# Patient Record
Sex: Female | Born: 1995 | Race: White | Hispanic: No | Marital: Single
Health system: Midwestern US, Academic
[De-identification: ages and names within clinical notes are randomized; demographics above are authoritative.]

---

## 2021-06-24 ENCOUNTER — Encounter: Admit: 2021-06-24 | Discharge: 2021-06-24 | Primary: Family

## 2021-06-24 ENCOUNTER — Emergency Department: Admit: 2021-06-24 | Discharge: 2021-06-24

## 2021-06-24 ENCOUNTER — Inpatient Hospital Stay: Admit: 2021-06-24

## 2021-06-24 DIAGNOSIS — F32A Depression: Secondary | ICD-10-CM

## 2021-06-24 DIAGNOSIS — I890 Lymphedema, not elsewhere classified: Secondary | ICD-10-CM

## 2021-06-24 DIAGNOSIS — E119 Type 2 diabetes mellitus without complications: Secondary | ICD-10-CM

## 2021-06-24 DIAGNOSIS — L03211 Cellulitis of face: Secondary | ICD-10-CM

## 2021-06-24 DIAGNOSIS — Z8742 Personal history of other diseases of the female genital tract: Secondary | ICD-10-CM

## 2021-06-24 DIAGNOSIS — I10 Essential (primary) hypertension: Secondary | ICD-10-CM

## 2021-06-24 DIAGNOSIS — F419 Anxiety disorder, unspecified: Secondary | ICD-10-CM

## 2021-06-24 DIAGNOSIS — Z789 Other specified health status: Secondary | ICD-10-CM

## 2021-06-24 LAB — BETA-HCG: BETA-HCG: <1 U/L (ref <5)

## 2021-06-24 LAB — CBC AND DIFF
ABSOLUTE BASO COUNT: 0 K/UL (ref 0–0.20)
ABSOLUTE EOS COUNT: 1.3 K/UL — ABNORMAL HIGH (ref 0–0.45)
ABSOLUTE MONO COUNT: 0.7 K/UL (ref 0–0.80)
MDW (MONOCYTE DISTRIBUTION WIDTH): 21 — ABNORMAL HIGH (ref <20.7)
WBC COUNT: 12 K/UL — ABNORMAL HIGH (ref 4.5–11.0)

## 2021-06-24 LAB — COMPREHENSIVE METABOLIC PANEL
ALBUMIN: 4.1 g/dL (ref 3.5–5.0)
ALK PHOSPHATASE: 28 U/L — ABNORMAL LOW (ref 25–110)
ALT: 19 U/L (ref 7–56)
ANION GAP: 13 K/UL — ABNORMAL HIGH (ref 3–12)
AST: 14 U/L (ref 7–40)
BLD UREA NITROGEN: 12 mg/dL (ref 7–25)
CALCIUM: 10 mg/dL (ref 8.5–10.6)
CHLORIDE: 98 MMOL/L — ABNORMAL LOW (ref 98–110)
CO2: 25 MMOL/L — ABNORMAL HIGH (ref 21–30)
CREATININE: 0.8 mg/dL (ref 0.4–1.00)
EGFR: >60 mL/min (ref >60)
GLUCOSE,PANEL: 243 mg/dL — ABNORMAL HIGH (ref 70–100)
POTASSIUM: 3.8 MMOL/L — ABNORMAL LOW (ref 3.5–5.1)
SODIUM: 136 MMOL/L — ABNORMAL LOW (ref 137–147)
TOTAL BILIRUBIN: 0.7 mg/dL (ref 0.3–1.2)
TOTAL PROTEIN: 7.5 g/dL (ref 6.0–8.0)

## 2021-06-24 LAB — POC LACTATE
LACTIC ACID POC: 2.2 MMOL/L — ABNORMAL HIGH (ref 0.5–2.0)
LACTIC ACID POC: 1.3 MMOL/L (ref 0.5–2.0)

## 2021-06-24 LAB — POC GLUCOSE
POC GLUCOSE: 270 mg/dL — ABNORMAL HIGH (ref 70–100)
POC GLUCOSE: 267 mg/dL — ABNORMAL HIGH (ref 70–100)

## 2021-06-24 MED ORDER — PANTOPRAZOLE 40 MG PO TBEC
40 mg | Freq: Every day | ORAL | 0 refills | Status: AC
Start: 2021-06-24 — End: ?
  Administered 2021-06-25 – 2021-06-28 (×4): 40 mg via ORAL

## 2021-06-24 MED ORDER — FLUOXETINE 20 MG PO CAP
40 mg | Freq: Every evening | ORAL | 0 refills | Status: AC
Start: 2021-06-24 — End: ?
  Administered 2021-06-25 – 2021-06-28 (×4): 40 mg via ORAL

## 2021-06-24 MED ORDER — FLUOXETINE 20 MG PO CAP
40 mg | Freq: Every day | ORAL | 0 refills | Status: DC
Start: 2021-06-24 — End: 2021-06-25

## 2021-06-24 MED ORDER — FENTANYL CITRATE (PF) 50 MCG/ML IJ SOLN
50 ug | Freq: Once | INTRAVENOUS | 0 refills | Status: CP
Start: 2021-06-24 — End: ?
  Administered 2021-06-24: 23:00:00 50 ug via INTRAVENOUS

## 2021-06-24 MED ORDER — METOPROLOL TARTRATE 25 MG PO TAB
25 mg | Freq: Two times a day (BID) | ORAL | 0 refills | Status: AC
Start: 2021-06-24 — End: ?
  Administered 2021-06-25 – 2021-06-28 (×8): 25 mg via ORAL

## 2021-06-24 MED ORDER — LACTOBACILLUS RHAMNOSUS GG 15 BILLION CELL PO CPSP
1 | Freq: Every day | ORAL | 0 refills | Status: AC
Start: 2021-06-24 — End: ?
  Administered 2021-06-25 – 2021-06-28 (×4): 1 via ORAL

## 2021-06-24 MED ORDER — IOHEXOL 350 MG IODINE/ML IV SOLN
80 mL | Freq: Once | INTRAVENOUS | 0 refills | Status: CP
Start: 2021-06-24 — End: ?
  Administered 2021-06-24: 20:00:00 80 mL via INTRAVENOUS

## 2021-06-24 MED ORDER — VANCOMYCIN 1.5G IN 0.9% NACL IVPB (BATCHED)
1500 mg | Freq: Two times a day (BID) | INTRAVENOUS | 0 refills | Status: DC
Start: 2021-06-24 — End: 2021-06-25

## 2021-06-24 MED ORDER — LACTATED RINGERS IV SOLP
30 mL/kg | INTRAVENOUS | 0 refills | Status: CP
Start: 2021-06-24 — End: ?
  Administered 2021-06-24: 22:00:00 1917 mL via INTRAVENOUS

## 2021-06-24 MED ORDER — VANCOMYCIN 2,500 MG IVPB
2500 mg | Freq: Once | INTRAVENOUS | 0 refills | Status: CP
Start: 2021-06-24 — End: ?
  Administered 2021-06-24 (×2): 2500 mg via INTRAVENOUS

## 2021-06-24 MED ORDER — ACETAMINOPHEN 500 MG PO TAB
500 mg | ORAL | 0 refills | Status: AC | PRN
Start: 2021-06-24 — End: ?
  Administered 2021-06-25 – 2021-06-27 (×3): 500 mg via ORAL

## 2021-06-24 MED ORDER — ENOXAPARIN 40 MG/0.4 ML SC SYRG
40 mg | Freq: Two times a day (BID) | SUBCUTANEOUS | 0 refills | Status: DC
Start: 2021-06-24 — End: 2021-06-25

## 2021-06-24 MED ORDER — PIPERACILLIN/TAZOBACTAM 4.5 G/100ML NS IVPB (MB+)
4.5 g | Freq: Once | INTRAVENOUS | 0 refills | Status: CP
Start: 2021-06-24 — End: ?
  Administered 2021-06-24 (×2): 4.5 g via INTRAVENOUS

## 2021-06-24 MED ORDER — LOSARTAN 50 MG PO TAB
100 mg | Freq: Every day | ORAL | 0 refills | Status: DC
Start: 2021-06-24 — End: 2021-06-25

## 2021-06-24 MED ORDER — SODIUM CHLORIDE 0.9 % IJ SOLN
50 mL | Freq: Once | INTRAVENOUS | 0 refills | Status: CP
Start: 2021-06-24 — End: ?
  Administered 2021-06-24: 20:00:00 50 mL via INTRAVENOUS

## 2021-06-24 MED ORDER — INSULIN ASPART 100 UNIT/ML SC FLEXPEN
0-6 [IU] | Freq: Before meals | SUBCUTANEOUS | 0 refills | Status: AC
Start: 2021-06-24 — End: ?
  Administered 2021-06-25: 03:00:00 2 [IU] via SUBCUTANEOUS

## 2021-06-24 MED ORDER — LOSARTAN 50 MG PO TAB
100 mg | Freq: Every evening | ORAL | 0 refills | Status: AC
Start: 2021-06-24 — End: ?
  Administered 2021-06-25 – 2021-06-28 (×4): 100 mg via ORAL

## 2021-06-24 MED ORDER — SODIUM CHLORIDE 0.9 % IV SOLP
1000 mL | Freq: Once | INTRAVENOUS | 0 refills | Status: CP
Start: 2021-06-24 — End: ?
  Administered 2021-06-25: 04:00:00 1000 mL via INTRAVENOUS

## 2021-06-24 MED ORDER — HYDROXYZINE HCL 25 MG PO TAB
25 mg | ORAL | 0 refills | Status: AC | PRN
Start: 2021-06-24 — End: ?
  Administered 2021-06-25 – 2021-06-27 (×2): 25 mg via ORAL

## 2021-06-24 MED ORDER — MELATONIN 5 MG PO TAB
5 mg | Freq: Every evening | ORAL | 0 refills | Status: AC | PRN
Start: 2021-06-24 — End: ?
  Administered 2021-06-25 – 2021-06-28 (×3): 5 mg via ORAL

## 2021-06-24 MED ORDER — ONDANSETRON HCL (PF) 4 MG/2 ML IJ SOLN
4 mg | INTRAVENOUS | 0 refills | Status: AC | PRN
Start: 2021-06-24 — End: ?

## 2021-06-24 MED ORDER — FUROSEMIDE 20 MG PO TAB
20 mg | Freq: Every morning | ORAL | 0 refills | Status: DC
Start: 2021-06-24 — End: 2021-06-25

## 2021-06-24 MED ORDER — PIPERACILLIN/TAZOBACTAM 4.5 G/100ML NS IVPB (MB+)
4.5 g | INTRAVENOUS | 0 refills | Status: AC
Start: 2021-06-24 — End: ?
  Administered 2021-06-25 (×6): 4.5 g via INTRAVENOUS

## 2021-06-24 MED ORDER — ENOXAPARIN 60 MG/0.6 ML SC SYRG
60 mg | Freq: Two times a day (BID) | SUBCUTANEOUS | 0 refills | Status: AC
Start: 2021-06-24 — End: ?
  Administered 2021-06-25 – 2021-06-28 (×7): 60 mg via SUBCUTANEOUS

## 2021-06-24 MED ORDER — VANCOMYCIN PHARMACY TO MANAGE
1 | 0 refills | Status: AC
Start: 2021-06-24 — End: ?

## 2021-06-24 MED ORDER — SODIUM CHLORIDE 0.9 % IV SOLP
INTRAVENOUS | 0 refills | Status: DC
Start: 2021-06-24 — End: 2021-06-25
  Administered 2021-06-25: 03:00:00 via INTRAVENOUS

## 2021-06-24 MED ORDER — VANCOMYCIN 1,750 MG IVPB
1750 mg | Freq: Two times a day (BID) | INTRAVENOUS | 0 refills | Status: AC
Start: 2021-06-24 — End: ?
  Administered 2021-06-25 – 2021-06-27 (×10): 1750 mg via INTRAVENOUS

## 2021-06-24 MED ORDER — ONDANSETRON 4 MG PO TBDI
4 mg | ORAL | 0 refills | Status: AC | PRN
Start: 2021-06-24 — End: ?

## 2021-06-24 MED ORDER — HYDROCHLOROTHIAZIDE 12.5 MG PO TAB
25 mg | Freq: Every morning | ORAL | 0 refills | Status: AC
Start: 2021-06-24 — End: ?
  Administered 2021-06-25 – 2021-06-28 (×4): 25 mg via ORAL

## 2021-06-24 MED ORDER — MORPHINE 4 MG/ML IV SYRG
4 mg | Freq: Once | INTRAVENOUS | 0 refills | Status: CP
Start: 2021-06-24 — End: ?
  Administered 2021-06-24: 19:00:00 4 mg via INTRAVENOUS

## 2021-06-24 MED ORDER — INSULIN GLARGINE 100 UNIT/ML (3 ML) SC INJ PEN
34 [IU] | Freq: Every evening | SUBCUTANEOUS | 0 refills | Status: AC
Start: 2021-06-24 — End: ?
  Administered 2021-06-25: 04:00:00 34 [IU] via SUBCUTANEOUS

## 2021-06-24 NOTE — ED Notes
Report attempt x1.

## 2021-06-24 NOTE — ED Notes
Report given to RN at [facility].

## 2021-06-24 NOTE — ED Notes
Report attempt x2.

## 2021-06-25 ENCOUNTER — Encounter: Admit: 2021-06-25 | Discharge: 2021-06-25 | Primary: Family

## 2021-06-25 MED ADMIN — SODIUM CHLORIDE 0.9 % IV PGBK (MB+) [95161]: 3 g | INTRAVENOUS | @ 22:00:00 | NDC 00338915930

## 2021-06-25 MED ADMIN — ACETAMINOPHEN 500 MG PO TAB [102]: 500 mg | ORAL | @ 05:00:00 | Stop: 2021-06-25 | NDC 00904673061

## 2021-06-25 MED ADMIN — AMPICILLIN-SULBACTAM 3 GRAM IJ SOLR [32471]: 3 g | INTRAVENOUS | @ 22:00:00 | NDC 00049001481

## 2021-06-25 NOTE — Consults
OTO/HNS Consult Note      Admission Date: 06/24/2021                                                LOS: 0 days    Reason for Consult: Facial cellulitis, rule out Ludwig's Angina    Consult type: Opinion with orders    Assessment/Plan   Samantha Valencia is a 25 y.o. female who presents with 1 week of left-sided facial swelling and pain with exam and imaging consistent with facial cellulitis.     -No acute ENT intervention recommended  -Patient is currently admitted to medicine service  -Defer antibiotic treatment to primary team and infectious disease  -Please contact ENT team if patient develops any progressive difficulty breathing, difficulty tolerating oral secretions, or fluctuance/fluid collection concerning for drainable abscess.    -Incidentally, patient noted to have significant crusting in bilateral nasal cavities with no signs of active infection.  -Recommend patient use nasal saline sprays twice daily for nasal crusting/dry mucosa.  -If patient were to require oxygen, recommend oxygen to be provided with humidity.      Patient discussed with Dr. Glendon Axe.  Thank you for this consult. Please page with any questions or concerns.    Alveta Heimlich, MD  Otolaryngology Resident  [phone] (Team Pager)  ______________________________________________________________________    History of Present Illness: Samantha Valencia is a 25 y.o. female with PMH anxiety, depression, diabetes, PCOS, hypertension who presents to [facility] ED with 1 week of facial swelling and pain.  Patient reports she was in her baseline state of health until approximately 1 week ago when she had a pimple on her left chin that began to get very irritated.  Patient reports she noted the surrounding skin started becoming red and swollen.  This initially progressed slowly but over the past few days has increased significantly.    Patient reports presenting to her PCP yesterday who started the patient on oral antibiotics.  Unfortunately, this did not provide the patient immediate relief and the following day (today), patient noticed further increase in swelling prompting her to present to [facility] ED.    In [facility] ED, patient is noted to be afebrile.  She denies difficulty breathing, significant voice changes, dysphagia, odynophagia.  Denies trismus.  CT scan obtained which shows infection is primarily superficial to platysma, though there is some evidence of inflammation deep to platysma in the submandibular space.  No drainable abscess is identified.  Patient denies history of any significant facial swelling/infection in the past.  Does report history of getting frequent infections including pharyngitis, sinus infections, and ear infections.  Incidentally, patient reports history of intermittent nosebleeds.  Reports that she does use regular steroid nasal spray but no nasal saline.    Medical History:   Diagnosis Date   ? Anxiety    ? Depression    ? Diabetes (HCC)    ? History of PCOS    ? Hypertension    ? Lymphedema      History reviewed. No pertinent surgical history.  Social History     Socioeconomic History   ? Marital status: Single   Tobacco Use   ? Smoking status: Never Smoker   ? Smokeless tobacco: Never Used   Substance and Sexual Activity   ? Alcohol use: Never   ? Drug use: Never     Family history  reviewed; non-contributory  Allergies:  Patient has no known allergies.    Scheduled Meds:enoxaparin (LOVENOX) syringe 40 mg, 40 mg, Subcutaneous, BID  [START ON 06/25/2021] FLUoxetine (PROzac) capsule 40 mg, 40 mg, Oral, QDAY  [START ON 06/25/2021] hydroCHLOROthiazide tablet 25 mg, 25 mg, Oral, QAM8  insulin aspart (U-100) (NOVOLOG FLEXPEN U-100 INSULIN) injection PEN 0-6 Units, 0-6 Units, Subcutaneous, ACHS (22)  insulin glargine (LANTUS SOLOSTAR U-100 INSULIN) injection PEN 34 Units, 34 Units, Subcutaneous, QHS  lactobacillus rhamnosus GG (CULTURELLE) 15 billion cell capsule 1 capsule, 1 capsule, Oral, QDAY w/lunch  [START ON 06/25/2021] losartan (COZAAR) tablet 100 mg, 100 mg, Oral, QDAY  metoprolol tartrate tablet 25 mg, 25 mg, Oral, BID  [START ON 06/25/2021] pantoprazole DR (PROTONIX) tablet 40 mg, 40 mg, Oral, QDAY 30 min before breakfast  piperacillin/tazobactam (ZOSYN) 4.5 g in sodium chloride 0.9% (NS) 100 mL IVPB (MB+), 4.5 g, Intravenous, Q6H*    Continuous Infusions:  ? sodium chloride 0.9 %   infusion       PRN and Respiratory Meds:acetaminophen Q4H PRN, melatonin QHS PRN, ondansetron Q6H PRN **OR** ondansetron (ZOFRAN) IV Q6H PRN, vancomycin, pharmacy to manage Per Pharmacy    Review of Systems:  10 point ROS conducted and negative except as noted in HPI.  Vital Signs:  Last Filed in 24 hours Vital Signs:  24 hour Range    BP: 118/100 (08/25 2005)  Temp: 36.9 ?C (98.4 ?F) (08/25 2005)  Pulse: 104 (08/25 2005)  Respirations: 18 PER MINUTE (08/25 2005)  SpO2: 98 % (08/25 2005)  O2 Device: None (Room air) (08/25 2005)  SpO2 Pulse: 96 (08/25 1900)  Height: 172.7 cm (5' 8) (08/25 2005) BP: (79-152)/(69-100)   Temp:  [36.2 ?C (97.2 ?F)-36.9 ?C (98.4 ?F)]   Pulse:  [100-104]   Respirations:  [18 PER MINUTE]   SpO2:  [93 %-99 %]   O2 Device: None (Room air)     Physical Exam:  General: Sitting upright in bed in no acute distress, morbidly obese  Inspection/palpation: Significant swelling of left cheek, chin, and left upper neck with overlying erythema.  This area is tender to palpation and moderately indurated.  No fluctuance.  Facial Strength: Facial motility symmetric and full bilaterally   Hearing: Hearing adequate for verbal communication bilaterally   Pinna: External ear intact and fully developed   External Canal: Canal with mild cerumen  Tympanic Membrane: TM intact bilaterally, no perforation or effusion  External Nose: No scar or anatomic deformity   Internal Nose: Septum intact. No anterior nasal drainage. No edema or polyp in anterior nasal cavity.  Oral cavity, Lips, Teeth, and Gums: Mucosa intact and viable, dentition in decent repair. No lesions, masses or ulcers.  Floor of mouth is soft.  Oropharynx: No exudate, no masses or ulcerations  Larynx: Normal voice, no stridor or stertor.   Neck, Trachea, Lymphatics: Midline trachea without mass or lesion, no significant lymphadenopathy  Thyroid: No mass or nodularity   Eyes: No nystagmus with equal extraocular motion bilaterally   Neuro/Psych/Balance: Patient appropriate in interaction; Appropriate mood and affect; Cranial nerves II-XII intact   Respiratory effort: Equal inspiration and expiration, no respiratory distress       LARYNGOSCOPY: Flexible laryngoscopy was performed using flexible laryngoscope through the left nare.  Significant nasal crusting and dried blood was observed in the bilateral nasal cavities.  No mucopus or signs of active infection was observed in the nose.  The nasopharyngeal mucosa appeared normal. The fossa of Rosenmuller was clear and the  eustachian tube openings were without mass or lesion. The tongue base was symmetric and without mucosal mass or lesion.  The vallecula was clear.  The epiglottis was upright.  The aryepiglottic folds and false folds appeared normal and were without mucosal mass or lesion.  The visualized hypopharynx was unremarkable.  The true vocal folds were without discernable mucosal or submucosal lesions.  The right and left cords adducted and abducted fully.  Though difficult to assess without stroboscopy, closure appeared to be complete.  A limited view of the subglottis revealed no mucosal lesions or stenosis.  The patient tolerated the procedure well.        Lab/Radiology/Other Diagnostic Tests:  24-hour labs:    Results for orders placed or performed during the hospital encounter of 06/24/21 (from the past 24 hour(s))   CBC AND DIFF    Collection Time: 06/24/21  2:10 PM   Result Value Ref Range    White Blood Cells 12.9 (H) 4.5 - 11.0 K/UL    RBC 3.96 (L) 4.0 - 5.0 M/UL    Hemoglobin 11.5 (L) 12.0 - 15.0 GM/DL    Hematocrit 09.8 (L) 36 - 45 %    MCV 87.0 80 - 100 FL    MCH 29.1 26 - 34 PG    MCHC 33.4 32.0 - 36.0 G/DL    RDW 11.9 11 - 15 %    Platelet Count 318 150 - 400 K/UL    MPV 7.5 7 - 11 FL    Neutrophils 69 41 - 77 %    Lymphocytes 15 (L) 24 - 44 %    Monocytes 6 4 - 12 %    Eosinophils 10 (H) 0 - 5 %    Basophils 0 0 - 2 %    Absolute Neutrophil Count 8.76 (H) 1.8 - 7.0 K/UL    Absolute Lymph Count 1.97 1.0 - 4.8 K/UL    Absolute Monocyte Count 0.77 0 - 0.80 K/UL    Absolute Eosinophil Count 1.32 (H) 0 - 0.45 K/UL    Absolute Basophil Count 0.04 0 - 0.20 K/UL    MDW (Monocyte Distribution Width) 21.4 (H) <20.7   COMPREHENSIVE METABOLIC PANEL    Collection Time: 06/24/21  2:10 PM   Result Value Ref Range    Sodium 136 (L) 137 - 147 MMOL/L    Potassium 3.8 3.5 - 5.1 MMOL/L    Chloride 98 98 - 110 MMOL/L    Glucose 243 (H) 70 - 100 MG/DL    Blood Urea Nitrogen 12 7 - 25 MG/DL    Creatinine 1.47 0.4 - 1.00 MG/DL    Calcium 82.9 8.5 - 56.2 MG/DL    Total Protein 7.5 6.0 - 8.0 G/DL    Total Bilirubin 0.7 0.3 - 1.2 MG/DL    Albumin 4.1 3.5 - 5.0 G/DL    Alk Phosphatase 28 25 - 110 U/L    AST (SGOT) 14 7 - 40 U/L    CO2 25 21 - 30 MMOL/L    ALT (SGPT) 19 7 - 56 U/L    Anion Gap 13 (H) 3 - 12    eGFR >60 >60 mL/min   BETA-HCG    Collection Time: 06/24/21  2:10 PM   Result Value Ref Range    Beta-HCG,Serum <1 <5 U/L   POC GLUCOSE    Collection Time: 06/24/21  2:13 PM   Result Value Ref Range    Glucose, POC 270 (H) 70 - 100 MG/DL   POC LACTATE  Collection Time: 06/24/21  2:16 PM   Result Value Ref Range    LACTIC ACID POC 2.2 (H) 0.5 - 2.0 MMOL/L   POC LACTATE    Collection Time: 06/24/21  4:42 PM   Result Value Ref Range    LACTIC ACID POC 1.3 0.5 - 2.0 MMOL/L   POC GLUCOSE    Collection Time: 06/24/21  8:22 PM   Result Value Ref Range    Glucose, POC 267 (H) 70 - 100 MG/DL         CT neck reviewed:  1. ?Left facial and upper neck cellulitis-myositis which is primarily superficial to platysma but also extends deep to   the platysma, involving the left submandibular, submental, and   buccal-labial spaces. No evidence of drainable fluid collection or soft   tissue gas.   2. ?Prominent and mildly enlarged cervical lymph nodes, favored to be   reactive.   3. ?Mildly prominent bilateral adenoidal and palatine tonsils, likely   reactive.     Problem List:  Patient Active Problem List    Diagnosis Date Noted   ? Facial cellulitis 06/24/2021

## 2021-06-25 NOTE — Drug Level
Pharmacy Vancomycin Note  Subjective:   Samantha Valencia is a 25 y.o. female being treated for sepsis due to facial cellulitis.    Assessment:   Target levels for this patient:  1.  AUC (mcg*h/mL):  400-600  2.   trough 15    Evaluation of AUC and/or level(s):   To be ordered later.       Plan:   1. 2500mg  Vanco received in ED at 1830==will continue in the morning with 1750mg  IV q12h   2. Next scheduled level(s):  TBD   3. Pharmacy will continue to monitor and adjust therapy as needed.      Objective:   Calculations:  Calculated True Peak (mcg/mL): 39.3 mcg/mL  Calculated Trough (mcg/mL):  13.2 mcg/mL  Rate of elimination (h-1):  0.1  Half Life (hr):  6.66 hours  Volume of distribution (L/kg): 0.4 L/kg  AUC (mcg*h/mL): 539 mcg*h/mL    Current Vancomycin Orders   Medication Dose Route Frequency   ? [START ON 06/25/2021] vancomycin (VANCOCIN) 1,750 mg in sodium chloride 0.9% (NS) 285 mL IVPB  1,750 mg Intravenous Q12H*   ? vancomycin, pharmacy to manage  1 each Service Per Pharmacy       Recent Vancomycin Dosing and Administration:  Recent Vancomycin Admin                   vancomycin (VANCOCIN) 2,500 mg in sodium chloride 0.9% (NS) 550 mL IVPB (mg) 2,500 mg New Bag 06/24/21 1537                Start Date of  vancomycin therapy: 06/24/2021  Additional ZOX:WRUEA 4.5g IV q6h    Cultures:  ,  ,  ,    White Blood Cells   Date/Time Value Ref Range Status   06/24/2021 1410 12.9 (H) 4.5 - 11.0 K/UL Final     Creatinine   Date/Time Value Ref Range Status   06/24/2021 1410 0.87 0.4 - 1.00 MG/DL Final     Blood Urea Nitrogen   Date/Time Value Ref Range Status   06/24/2021 1410 12 7 - 25 MG/DL Final     Estimated CrCl:  164ml/min      Intake/Output Summary (Last 24 hours) at 06/24/2021 2348  Last data filed at 06/24/2021 2300  Gross per 24 hour   Intake 3117 ml   Output --   Net 3117 ml      UOP:    Actual Weight:  156.7 kg (345 lb 6.4 oz)  Dosing BW:  156.7 kg   Drug Levels:  No results found for: VANCOMYCIN 2HR POST DOSE, VANCOMYCIN TROUGH, VANCOMYCIN RANDOM    Fredric Mare, [facility]  06/24/2021

## 2021-06-26 MED ADMIN — AMPICILLIN-SULBACTAM 3 GRAM IJ SOLR [32471]: 3 g | INTRAVENOUS | @ 10:00:00 | NDC 00049001481

## 2021-06-26 MED ADMIN — SODIUM CHLORIDE 0.9 % IV PGBK (MB+) [95161]: 3 g | INTRAVENOUS | @ 17:00:00 | NDC 00338915930

## 2021-06-26 MED ADMIN — AMPICILLIN-SULBACTAM 3 GRAM IJ SOLR [32471]: 3 g | INTRAVENOUS | @ 04:00:00 | NDC 00049001481

## 2021-06-26 MED ADMIN — AMPICILLIN-SULBACTAM 3 GRAM IJ SOLR [32471]: 3 g | INTRAVENOUS | @ 17:00:00 | NDC 00049001481

## 2021-06-26 MED ADMIN — SODIUM CHLORIDE 0.9 % IV PGBK (MB+) [95161]: 3 g | INTRAVENOUS | @ 04:00:00 | NDC 00338915930

## 2021-06-26 MED ADMIN — SODIUM CHLORIDE 0.9 % IV PGBK (MB+) [95161]: 3 g | INTRAVENOUS | @ 10:00:00 | NDC 00338915930

## 2021-06-26 MED ADMIN — SODIUM CHLORIDE 0.9 % IV PGBK (MB+) [95161]: 3 g | INTRAVENOUS | @ 21:00:00 | NDC 00338915930

## 2021-06-26 MED ADMIN — AMPICILLIN-SULBACTAM 3 GRAM IJ SOLR [32471]: 3 g | INTRAVENOUS | @ 21:00:00 | NDC 00049001481

## 2021-06-27 ENCOUNTER — Inpatient Hospital Stay: Admit: 2021-06-27 | Discharge: 2021-06-27 | Primary: Family

## 2021-06-27 MED ADMIN — AMPICILLIN-SULBACTAM 3 GRAM IJ SOLR [32471]: 3 g | INTRAVENOUS | @ 16:00:00 | NDC 00049001481

## 2021-06-27 MED ADMIN — AMPICILLIN-SULBACTAM 3 GRAM IJ SOLR [32471]: 3 g | INTRAVENOUS | @ 09:00:00 | NDC 00049001481

## 2021-06-27 MED ADMIN — AMPICILLIN-SULBACTAM 3 GRAM IJ SOLR [32471]: 3 g | INTRAVENOUS | @ 22:00:00 | NDC 00049001481

## 2021-06-27 MED ADMIN — SODIUM CHLORIDE 0.9 % IV PGBK (MB+) [95161]: 3 g | INTRAVENOUS | @ 22:00:00 | NDC 00338915930

## 2021-06-27 MED ADMIN — SODIUM CHLORIDE 0.9 % IV PGBK (MB+) [95161]: 3 g | INTRAVENOUS | @ 03:00:00 | NDC 00338915930

## 2021-06-27 MED ADMIN — SODIUM CHLORIDE 0.9 % IV PGBK (MB+) [95161]: 3 g | INTRAVENOUS | @ 16:00:00 | NDC 00338915930

## 2021-06-27 MED ADMIN — AMPICILLIN-SULBACTAM 3 GRAM IJ SOLR [32471]: 3 g | INTRAVENOUS | @ 03:00:00 | NDC 00049001481

## 2021-06-27 MED ADMIN — SODIUM CHLORIDE 0.9 % IV PGBK (MB+) [95161]: 3 g | INTRAVENOUS | @ 09:00:00 | NDC 00338915930

## 2021-06-28 ENCOUNTER — Encounter: Admit: 2021-06-28 | Discharge: 2021-06-28 | Primary: Family

## 2021-06-28 MED ADMIN — AMPICILLIN-SULBACTAM 3 GRAM IJ SOLR [32471]: 3 g | INTRAVENOUS | @ 03:00:00 | NDC 70594008201

## 2021-06-28 MED ADMIN — SODIUM CHLORIDE 0.9 % IV PGBK (MB+) [95161]: 3 g | INTRAVENOUS | @ 09:00:00 | Stop: 2021-06-28 | NDC 00338915930

## 2021-06-28 MED ADMIN — SODIUM CHLORIDE 0.9 % IV PGBK (MB+) [95161]: 3 g | INTRAVENOUS | @ 03:00:00 | NDC 00338915930

## 2021-06-28 MED ADMIN — SODIUM CHLORIDE 0.9 % IV PGBK (MB+) [95161]: 3 g | INTRAVENOUS | @ 16:00:00 | Stop: 2021-06-28 | NDC 00338915930

## 2021-06-28 MED ADMIN — AMPICILLIN-SULBACTAM 3 GRAM IJ SOLR [32471]: 3 g | INTRAVENOUS | @ 09:00:00 | Stop: 2021-06-28 | NDC 70594008201

## 2021-06-28 MED ADMIN — AMPICILLIN-SULBACTAM 3 GRAM IJ SOLR [32471]: 3 g | INTRAVENOUS | @ 16:00:00 | Stop: 2021-06-28 | NDC 70594008201

## 2021-06-28 MED FILL — INSULIN ASPART 100 UNIT/ML SC FLEXPEN: 100 unit/mL (3 mL) | SUBCUTANEOUS | 100 days supply | Qty: 15 | Fill #1 | Status: CP

## 2021-06-28 MED FILL — SULFAMETHOXAZOLE-TRIMETHOPRIM 800-160 MG PO TAB: 160/800 mg | ORAL | 10 days supply | Qty: 20 | Fill #1 | Status: CP

## 2021-09-23 ENCOUNTER — Emergency Department: Admit: 2021-09-23 | Discharge: 2021-09-23 | Payer: Commercial Managed Care - PPO

## 2021-09-23 ENCOUNTER — Encounter: Admit: 2021-09-23 | Discharge: 2021-09-23 | Payer: Commercial Managed Care - PPO | Primary: Family

## 2021-09-23 DIAGNOSIS — F419 Anxiety disorder, unspecified: Secondary | ICD-10-CM

## 2021-09-23 DIAGNOSIS — I10 Essential (primary) hypertension: Secondary | ICD-10-CM

## 2021-09-23 DIAGNOSIS — Z8742 Personal history of other diseases of the female genital tract: Secondary | ICD-10-CM

## 2021-09-23 DIAGNOSIS — I890 Lymphedema, not elsewhere classified: Secondary | ICD-10-CM

## 2021-09-23 DIAGNOSIS — F32A Depression: Secondary | ICD-10-CM

## 2021-09-23 DIAGNOSIS — E119 Type 2 diabetes mellitus without complications: Secondary | ICD-10-CM

## 2021-09-23 LAB — COMPREHENSIVE METABOLIC PANEL
ALBUMIN: 4.4 g/dL (ref 3.5–5.0)
ALK PHOSPHATASE: 22 U/L — ABNORMAL LOW (ref 25–110)
ALT: 15 U/L (ref 7–56)
ANION GAP: 14 K/UL — ABNORMAL HIGH (ref 3–12)
AST: 13 U/L (ref 7–40)
BLD UREA NITROGEN: 16 mg/dL (ref 7–25)
CALCIUM: 9.7 mg/dL (ref 8.5–10.6)
CO2: 23 MMOL/L (ref 21–30)
CREATININE: 0.8 mg/dL (ref 0.4–1.00)
EGFR: >60 mL/min (ref >60)
GLUCOSE,PANEL: 162 mg/dL — ABNORMAL HIGH (ref 70–100)
SODIUM: 138 MMOL/L (ref 137–147)
TOTAL BILIRUBIN: 0.4 mg/dL (ref 0.3–1.2)
TOTAL PROTEIN: 8.1 g/dL — ABNORMAL HIGH (ref 6.0–8.0)

## 2021-09-23 LAB — CBC AND DIFF
ABSOLUTE BASO COUNT: 0 K/UL (ref 0–0.20)
ABSOLUTE EOS COUNT: 0.5 K/UL — ABNORMAL HIGH (ref 0–0.45)
ABSOLUTE MONO COUNT: 0.6 K/UL (ref 0–0.80)
MDW (MONOCYTE DISTRIBUTION WIDTH): 19 (ref <20.7)
WBC COUNT: 11 K/UL — ABNORMAL HIGH (ref <20.7)

## 2021-09-23 LAB — POC TROPONIN: TROPONIN I, POC: 0 ng/mL (ref 0.00–0.05)

## 2021-09-23 LAB — POC GLUCOSE: POC GLUCOSE: 181 mg/dL — ABNORMAL HIGH (ref 70–100)

## 2021-09-23 LAB — PHOSPHORUS: PHOSPHORUS: 3.9 mg/dL — ABNORMAL LOW (ref 2.0–4.5)

## 2021-09-23 LAB — MAGNESIUM: MAGNESIUM: 1.4 mg/dL — ABNORMAL LOW (ref 1.6–2.6)

## 2021-09-23 MED ORDER — ACETAMINOPHEN 500 MG PO TAB
1000 mg | Freq: Once | ORAL | 0 refills | Status: CP
Start: 2021-09-23 — End: ?
  Administered 2021-09-24: 06:00:00 1000 mg via ORAL

## 2021-09-23 MED ORDER — MAGNESIUM SULFATE IN D5W 1 GRAM/100 ML IV PGBK
1 g | Freq: Once | INTRAVENOUS | 0 refills | Status: AC
Start: 2021-09-23 — End: ?
  Administered 2021-09-24: 07:00:00 1 g via INTRAVENOUS

## 2021-09-23 MED ORDER — LACTATED RINGERS IV SOLP
1000 mL | INTRAVENOUS | 0 refills | Status: CP
Start: 2021-09-23 — End: ?
  Administered 2021-09-24: 06:00:00 1000 mL via INTRAVENOUS

## 2021-09-23 MED ORDER — DIPHENHYDRAMINE HCL 50 MG/ML IJ SOLN
25 mg | Freq: Once | INTRAVENOUS | 0 refills | Status: CP
Start: 2021-09-23 — End: ?
  Administered 2021-09-24: 06:00:00 25 mg via INTRAVENOUS

## 2021-09-23 MED ORDER — PROCHLORPERAZINE EDISYLATE 5 MG/ML IJ SOLN
10 mg | Freq: Once | INTRAVENOUS | 0 refills | Status: CP
Start: 2021-09-23 — End: ?
  Administered 2021-09-24: 06:00:00 10 mg via INTRAVENOUS

## 2021-09-24 ENCOUNTER — Emergency Department: Admit: 2021-09-24 | Discharge: 2021-09-23 | Payer: Commercial Managed Care - PPO

## 2021-10-06 ENCOUNTER — Encounter: Admit: 2021-10-06 | Discharge: 2021-10-06 | Payer: Commercial Managed Care - PPO | Primary: Family

## 2021-10-06 DIAGNOSIS — G43009 Migraine without aura, not intractable, without status migrainosus: Secondary | ICD-10-CM

## 2021-10-06 LAB — CBC AND DIFF
ABSOLUTE BASO COUNT: 0 K/UL (ref 0–0.20)
ABSOLUTE EOS COUNT: 0.5 K/UL — ABNORMAL HIGH (ref 0–0.45)
ABSOLUTE MONO COUNT: 0.7 K/UL (ref 0–0.80)
WBC COUNT: 12 K/UL — ABNORMAL HIGH (ref 4.5–11.0)

## 2021-10-06 LAB — COMPREHENSIVE METABOLIC PANEL
ALBUMIN: 4.1 g/dL (ref 3.5–5.0)
ALK PHOSPHATASE: 21 U/L — ABNORMAL LOW (ref 25–110)
ALT: 19 U/L (ref 7–56)
ANION GAP: 11 K/UL — ABNORMAL HIGH (ref 3–12)
AST: 23 U/L (ref 7–40)
BLD UREA NITROGEN: 16 mg/dL (ref 7–25)
CALCIUM: 9.9 mg/dL (ref 8.5–10.6)
CHLORIDE: 101 MMOL/L — ABNORMAL LOW (ref 98–110)
CO2: 24 MMOL/L (ref 21–30)
CREATININE: 1 mg/dL — ABNORMAL HIGH (ref 0.4–1.00)
EGFR: >60 mL/min (ref >60)
GLUCOSE,PANEL: 150 mg/dL — ABNORMAL HIGH (ref 70–100)
POTASSIUM: 4.7 MMOL/L — ABNORMAL LOW (ref 3.5–5.1)
SODIUM: 136 MMOL/L — ABNORMAL LOW (ref 137–147)
TOTAL BILIRUBIN: 0.4 mg/dL (ref 0.3–1.2)
TOTAL PROTEIN: 7.7 g/dL (ref 6.0–8.0)

## 2021-10-06 MED ORDER — MAGNESIUM SULFATE IN D5W 1 GRAM/100 ML IV PGBK
1 g | Freq: Once | INTRAVENOUS | 0 refills | Status: CP
Start: 2021-10-06 — End: ?
  Administered 2021-10-07: 04:00:00 1 g via INTRAVENOUS

## 2021-10-06 MED ORDER — DIPHENHYDRAMINE HCL 50 MG/ML IJ SOLN
25 mg | Freq: Once | INTRAVENOUS | 0 refills | Status: CP
Start: 2021-10-06 — End: ?
  Administered 2021-10-07: 04:00:00 25 mg via INTRAVENOUS

## 2021-10-06 MED ORDER — PROCHLORPERAZINE EDISYLATE 5 MG/ML IJ SOLN
10 mg | Freq: Once | INTRAVENOUS | 0 refills | Status: CP
Start: 2021-10-06 — End: ?
  Administered 2021-10-07: 04:00:00 10 mg via INTRAVENOUS

## 2021-10-06 MED ORDER — KETOROLAC 15 MG/ML IJ SOLN
15 mg | Freq: Once | INTRAVENOUS | 0 refills | Status: CP
Start: 2021-10-06 — End: ?
  Administered 2021-10-07: 04:00:00 15 mg via INTRAVENOUS

## 2021-10-06 MED ORDER — LACTATED RINGERS IV SOLP
1000 mL | INTRAVENOUS | 0 refills | Status: CP
Start: 2021-10-06 — End: ?
  Administered 2021-10-07: 04:00:00 1000 mL via INTRAVENOUS

## 2021-10-07 ENCOUNTER — Emergency Department: Admit: 2021-10-07 | Discharge: 2021-10-06 | Payer: Commercial Managed Care - PPO

## 2021-10-07 MED ORDER — DIPHENHYDRAMINE HCL 25 MG PO CAP
25 mg | ORAL_CAPSULE | ORAL | 0 refills | 6.00000 days | Status: AC
Start: 2021-10-07 — End: ?

## 2021-10-07 MED ORDER — PROCHLORPERAZINE MALEATE 10 MG PO TAB
10 mg | ORAL_TABLET | ORAL | 0 refills | Status: AC | PRN
Start: 2021-10-07 — End: ?

## 2021-10-07 MED ORDER — IBUPROFEN 800 MG PO TAB
800 mg | ORAL_TABLET | ORAL | 0 refills | Status: AC | PRN
Start: 2021-10-07 — End: ?

## 2021-10-07 NOTE — ED Notes
Report taken from [place], [place] and care is assumed. Pt A&Ox4, skin w/d/i, eupenic on RA with bed lowered and side rails x 2 upright and call light next to Pt. Mother, at bedside, asking for another CT scan of the head. Bri, APRN notified.

## 2021-10-07 NOTE — ED Notes
25 yo female presents to [facility] w/ cc of migraine. Pt reports that she has had a migraine since before Thanksgiving. Reports that she was seen on Thanksgiving for her migraine. Symptoms were treated w/ meds and pt was discharged. Reports that her migraine is getting worse xcouple days and that it now located on her R side of her head--was on the L side of her head. Pt reports that she has pain down her neck when she moves her head. Denies photo/phonophobia, NV, vision changes. Pt is A&O x4, respirations even and NL, pulses palp bilaterally. Cart locked in lowest position, bed rails raised x2, call light within reach.

## 2021-10-07 NOTE — Unmapped
We have prescribed compazine with benadryl for you to take as needed as directed for migraine headache. If headache has not improved after 30 minutes, you may also take motrin. We recommend using the # to call and arrange for ER follow-up with neurology. Return for worsening of symptoms.

## 2021-10-08 ENCOUNTER — Encounter: Admit: 2021-10-08 | Discharge: 2021-10-08 | Payer: Commercial Managed Care - PPO | Primary: Family

## 2021-10-08 ENCOUNTER — Ambulatory Visit: Admit: 2021-10-08 | Discharge: 2021-10-08 | Payer: Commercial Managed Care - PPO | Primary: Family

## 2021-10-08 DIAGNOSIS — Z8742 Personal history of other diseases of the female genital tract: Secondary | ICD-10-CM

## 2021-10-08 DIAGNOSIS — G43109 Migraine with aura, not intractable, without status migrainosus: Principal | ICD-10-CM

## 2021-10-08 DIAGNOSIS — E119 Type 2 diabetes mellitus without complications: Secondary | ICD-10-CM

## 2021-10-08 DIAGNOSIS — F419 Anxiety disorder, unspecified: Secondary | ICD-10-CM

## 2021-10-08 DIAGNOSIS — I890 Lymphedema, not elsewhere classified: Secondary | ICD-10-CM

## 2021-10-08 DIAGNOSIS — F32A Depression: Secondary | ICD-10-CM

## 2021-10-08 DIAGNOSIS — I10 Essential (primary) hypertension: Secondary | ICD-10-CM

## 2021-10-08 MED ORDER — TOPIRAMATE 25 MG PO TAB
ORAL_TABLET | 5 refills | Status: AC
Start: 2021-10-08 — End: ?

## 2021-10-08 MED ORDER — SUMATRIPTAN SUCCINATE 50 MG PO TAB
50 mg | ORAL_TABLET | ORAL | 3 refills | Status: AC | PRN
Start: 2021-10-08 — End: ?

## 2021-10-08 NOTE — Progress Notes
Today's Date: 10/08/21     The MIDAS (Migraine Disability Assessment) questionnaire was put together to help you measure the impact your headaches have on your life. The information on this questionnaire is also helpful for your provider to determine the level of pain and disability caused by your headaches and to find the best treatment for you.     INSTRUCTIONS    Please answer the following questions about ALL of the headaches you have had over the last 3 months. Select your answer in the box next to each question. Select zero if you did not have the activity in the last 3 months. Please take the completed form to your healthcare professional.   10 1. On how many days in the last 3 months did you miss work or school because of your headaches?  9 2. How many days in the last 3 months was your productivity at work or school reduced by half or more because of your headaches? (Do not include days you counted in question 1 where you missed work or school.)  15 3. On how many days in the last 3 months did you not do household work (such as housework, home repairs and maintenance, shopping, caring for children and relatives) because of your headaches?  9 4. How many days in the last 3 months was your productivity in household work reduced by half of more because of your headaches? (Do not include days you counted in question 3 where you did not do household work.)  2 5. On how many days in the last 3 months did you miss family, social or leisure activities because of your headaches?  45 Total (Questions 1-5)     What your Physician will need to know about your headache:  21 A. On how many days in the last 3 months did you have a headache? (If a headache lasted more than 1 day, count each day.)  6 B. On a scale of 0 - 10, on average how painful were these headaches? (where 0=no pain at all, and 10= pain as bad as it can be.)

## 2021-10-08 NOTE — Progress Notes
Date of Service: 10/08/2021    Subjective:             Samantha Valencia is a 25 y.o. female with migraines.     History of Present Illness  Samantha Valencia 25 y.o. female with history of anxiety, DM, PCOS, HTN and migraines who presented to neurology for evaluation of migraines.     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 The headaches are located on the right posterior head region but occasionally may have it on the loft posterior region. The headache feels like throbbing and ache as well and has associated photophobia, phonophobia and nausea. Recently she also noticed dizziness with spinning sensation. In the past couple of years she has not had menstrual cycle because of birthcontrol so has not had migraine but since middle of November she has been having almost everyday headache. The migraine cocktail she got from ED helped reduce the headaches.     She reports that she had neck pain in the past two weeks and also pain on the left shoulder blade as well and was given migraine cocktail which helped the pain.     Review of Records:       Medical History:   Diagnosis Date   ? Anxiety    ? Depression    ? Diabetes (HCC)    ? History of PCOS    ? Hypertension    ? Lymphedema          No past surgical history on file.      Social History     Socioeconomic History   ? Marital status: Single   Tobacco Use   ? Smoking status: Never   ? Smokeless tobacco: Never   Substance and Sexual Activity   ? Alcohol use: Never   ? Drug use: Never         Family History   Problem Relation Age of Onset   ? Bipolar Disorder Mother    ? Schizophrenia Father        No Known Allergies         Review of Systems      Objective:         ? cetirizine (ZYRTEC) 10 mg tablet Take 10 mg by mouth at bedtime daily.   ? cholecalciferol (vitamin D3) (VITAMIN D3 PO) Take 2 Gummy by mouth daily. 2 gummies = 2000 IU   ? diphenhydrAMINE HCL (BENADRYL) 25 mg capsule Take one capsule by mouth every 8 hours.   ? empagliflozin (JARDIANCE) 10 mg tablet Take 10 mg by mouth daily.   ? fluoxetine (PROZAC) 40 mg capsule Take 40 mg by mouth at bedtime daily.   ? fluticasone propionate (FLONASE) 50 mcg/actuation nasal spray, suspension Apply 2 sprays to each nostril as directed daily as needed. Shake bottle gently before using.   ? furosemide (LASIX) 20 mg tablet Take 20 mg by mouth every morning.   ? glipiZIDE (GLUCOTROL) 10 mg tablet Take 10 mg by mouth twice daily.   ? hydroCHLOROthiazide (HYDRODIURIL) 25 mg tablet Take 25 mg by mouth every morning.   ? hydrOXYzine HCL (ATARAX) 25 mg tablet Take 25 mg by mouth twice daily. Indications: anxious   ? ibuprofen (ADVIL) 200 mg tablet Take 400 mg by mouth every 12 hours as needed for Pain. Take with food.   ? ibuprofen (MOTRIN) 800 mg tablet Take one tablet by mouth every 8 hours  as needed for Pain. Take with food.   ? insulin aspart (U-100) (NOVOLOG FLEXPEN U-100 INSULIN) 100 unit/mL (3 mL) PEN Inject five Units under the skin three times daily with meals.   ? insulin detemir U-100(+) (LEVEMIR) 100 unit/mL vial Inject 34 Units under the skin at bedtime daily.   ? losartan (COZAAR) 50 mg tablet Take 100 mg by mouth at bedtime daily.   ? Magnesium 250 mg tab Take 250 mg by mouth daily.   ? medroxyPROGESTERone (DEPO-PROVERA) 150 mg/mL injection Inject 150 mg into the muscle every 90 days.   ? melatonin 5 mg chew Chew 3 Gummy by mouth at bedtime as needed.   ? metFORMIN (GLUCOPHAGE) 1,000 mg tablet Take 1,000 mg by mouth twice daily with meals.   ? metoprolol tartrate 25 mg tablet Take 25 mg by mouth twice daily.   ? omeprazole DR (PRILOSEC) 20 mg capsule Take 20 mg by mouth daily before breakfast.   ? other medication Medication Name & Strength: [place] Probiotic Multi-Enzyme Digestive Formula Tablets    Dose(how many): 3 tablets    Frequency(how often): daily   ? other medication Medication Name & Strength: AZO Yeast Plus    Dose(how many): 1 tablet    Frequency(how often): three times daily as needed   ? PROAIR HFA 90 mcg/actuation inhaler Inhale 2 puffs by mouth into the lungs every 4 hours as needed.   ? prochlorperazine maleate (COMPAZINE) 10 mg tablet Take one tablet by mouth every 8 hours as needed for Other... (take with benadryl as needed for migraine headache).   ? SITagliptin (JANUVIA) 100 mg tablet Take 100 mg by mouth daily.   ? SUMAtriptan succinate (IMITREX) 50 mg tablet Take one tablet by mouth as Needed. Dose may be repeated in 2 hours if needed. Max of 2 tablets in 24 hours.   ? tizanidine HCl (TIZANIDINE PO) Take  by mouth as Needed.   ? topiramate (TOPAMAX) 25 mg tablet Take 25mg  at PM for 1 wk then 25mg  AM and PM for 1wk then 50mg  at PM and 25mg  at AM for 1wk then 50mg  AM and PM     Vitals:    10/08/21 0859 10/08/21 0901   BP: (!) 159/90 119/74   BP Source: Arm, Left Lower Arm, Right Upper   Pulse: 79 67   SpO2: 98%    PainSc: Six    Weight: (!) 153.8 kg (339 lb)    Height: 172.7 cm (5' 8)      Body mass index is 51.54 kg/m?Marland Kitchen     Physical Exam  General: no acute distress, cooperative    HEENT: normocephalic, atraumatic, fundi benign   CV: well perfused  Pulm: equal chest rise, non labored breathing  Ext: no swelling, cyanosis and clubbing     Neurological Exam   Mental status: Alert and oriented to time, place, person and situation.  Speech: Fluent, with normal naming, comprehension, articulation and repetition  CN II-XII: Visual fields intact to confrontation, PERRL (4->2), EOMI, facial sensation intact. Symmetrical facial movement. Hearing grossly intact. Strong cough, elevates palate, uvula midline. Strong shoulder shrug. Tongue midline.  Motor: (R/L) b/l UE/LE 5/5 in proximal and distal muscle groups. Normal tone and bulk. No abnormal movement, fasciculation or pronator drift.  Sensory: intact light touch, pin prick, proprioception, and vibration. Without sensory level.   Reflexes: (R/L) 2/2Bj, 2/2Trj, 2/2Brj, 2/2Knj, 2/2Aj. No clonus, hofmann, cross adductor. Babinski negative.  Coordination/ fine movement: Normal finger to nose, heel to shin.  Gait: Normal stance and steady gait. Romberg sign absent.       Assessment and Plan:  Samantha Valencia 25 y.o. female with history of anxiety, DM, PCOS, HTN and migraines who presented to neurology for evaluation of migraines.     1. Migraine with aura and without status migrainosus, not intractable      RECOMMENDATIONS  - Advised adequate hydration to avoid dehydration related headache.   - MRI brain with and without contrast for further evaluation.  - Start magnesium oxide 400-500mg  daily and riboflavin 400mg  daily.   - Start topiramate after titration: Take 25mg  at PM for 1 wk then 25mg  AM and PM for 1wk then 50mg  at PM and 25mg  at AM for 1wk then 50mg  AM and PM.  - For severe migraine take sumatriptan 50mg . Dose could be repeated in 2 hours of no relief. Max of 2 tablets per day. Total of 9 tablets per months     Orders Placed This Encounter   ? MRI HEAD WO/W CONTRAST   ? topiramate (TOPAMAX) 25 mg tablet   ? SUMAtriptan succinate (IMITREX) 50 mg tablet       FOLLOWUP PLAN  Return in about 6 months (around 04/08/2022).  The patient is instructed to contact me if there are any concerns with the agreed plan.  Problem   Migraine With Aura and Without Status Migrainosus, Not Intractable        Total time 60 minutes.  Estimated counseling time was more than 50% of the visit time. Counseled patient regarding migraine headache and preventive treatment.       Linden Dolin, MD  Clinical Assistant Professor   [employer] [facility] [employer]

## 2021-10-08 NOTE — Patient Instructions
Samantha Valencia 25 y.o. female with history of anxiety, DM, PCOS, HTN and migraines who presented to neurology for evaluation of migraines.     1. Migraine with aura and without status migrainosus, not intractable      RECOMMENDATIONS  - Advised adequate hydration to avoid dehydration related headache.   - MRI brain with and without contrast for further evaluation.  - Start magnesium oxide 400-500mg  daily and riboflavin 400mg  daily.   - Start topiramate after titration: Take 25mg  at PM for 1 wk then 25mg  AM and PM for 1wk then 50mg  at PM and 25mg  at AM for 1wk then 50mg  AM and PM.  - For severe migraine take sumatriptan 50mg . Dose could be repeated in 2 hours of no relief. Max of 2 tablets per day. Total of 9 tablets per months     Orders Placed This Encounter    MRI HEAD WO/W CONTRAST    topiramate (TOPAMAX) 25 mg tablet    SUMAtriptan succinate (IMITREX) 50 mg tablet       FOLLOWUP PLAN  Return in about 6 months (around 04/08/2022).  The patient is instructed to contact me if there are any concerns with the agreed plan.      -- Preferred method of communication is through [employer], if the issue cannot wait until your next scheduled follow up.   -- MyChart may be used for non-emergent communication. Emails are not reviewed after hours or over the weekend/holidays/after 4PM. Staff will reply to your email within 24-48 business hours.       -- If you do not hear from [place] within one week of a lab or imaging study being completed, please call/send my chart email to the office to be sure that we have received the results. This is especially challenging when tests are done outside of the [facility] system, as many times results do not make it back to our office for a variety of reasons. In our office no news is good news does not apply. You should hear from [place] with results for each test.    -- If you are having acute (new/sudden onset) or severe/worsening neurologic symptoms, please call 911 or seek care in ED.    -- For scheduling of IMAGING/RADIOLOGY, please call [phone] at your convenience to schedule your studies.  -- For referrals placed during the visit, if you have not heard from scheduling within one week, please call the call center at [phone] to get scheduling assistance.  -- For refills on medications, please first contact your pharmacy, who will fax a refill authorization request form to our office.  Weekdays only. Allow up to 2 business days for refills. Please plan ahead, as refills will not be filled after hours.  -- Our front desk staff, Selena Batten or Marcelino Duster may be reached at [phone] for scheduling needs.   -- Heather RN, may be contacted at [phone] for urgent needs. Staff will return your call within 24 business hours.     For Appointments:   -- Please try to arrive early for your appointment time to help facilitate your visit. 15 minutes early is recommended.   -- If you are late to your appointment, we reserve the right to ask you to reschedule or wait until next available time to be seen in fairness to other patients scheduled that day.   -- There are times when we are running behind in clinic. Our goal is to always be on time, however, there are time when unexpected events  occur with patients, which may cause a delay. We appreciate your understanding when this occurs.

## 2021-10-13 ENCOUNTER — Encounter: Admit: 2021-10-13 | Discharge: 2021-10-13 | Payer: Commercial Managed Care - PPO | Primary: Family

## 2021-10-14 ENCOUNTER — Encounter: Admit: 2021-10-14 | Discharge: 2021-10-14 | Payer: Commercial Managed Care - PPO | Primary: Family

## 2021-11-16 ENCOUNTER — Encounter: Admit: 2021-11-16 | Discharge: 2021-11-16 | Payer: Commercial Managed Care - PPO | Primary: Family

## 2021-11-20 ENCOUNTER — Encounter: Admit: 2021-11-20 | Discharge: 2021-11-20 | Payer: Commercial Managed Care - PPO | Primary: Family

## 2021-11-20 ENCOUNTER — Ambulatory Visit: Admit: 2021-11-20 | Discharge: 2021-11-20 | Payer: Commercial Managed Care - PPO | Primary: Family

## 2021-11-20 DIAGNOSIS — G43109 Migraine with aura, not intractable, without status migrainosus: Secondary | ICD-10-CM

## 2021-11-20 MED ORDER — GADOBENATE DIMEGLUMINE 529 MG/ML (0.1MMOL/0.2ML) IV SOLN
20 mL | Freq: Once | INTRAVENOUS | 0 refills | Status: CP
Start: 2021-11-20 — End: ?
  Administered 2021-11-20: 20:00:00 20 mL via INTRAVENOUS

## 2022-01-04 ENCOUNTER — Encounter: Admit: 2022-01-04 | Discharge: 2022-01-04 | Payer: Commercial Managed Care - PPO | Primary: Family

## 2022-04-08 ENCOUNTER — Encounter: Admit: 2022-04-08 | Discharge: 2022-04-08 | Payer: Commercial Managed Care - PPO | Primary: Family

## 2022-04-08 ENCOUNTER — Ambulatory Visit: Admit: 2022-04-08 | Discharge: 2022-04-09 | Payer: Commercial Managed Care - PPO | Primary: Family

## 2022-04-08 DIAGNOSIS — I890 Lymphedema, not elsewhere classified: Secondary | ICD-10-CM

## 2022-04-08 DIAGNOSIS — Z8742 Personal history of other diseases of the female genital tract: Secondary | ICD-10-CM

## 2022-04-08 DIAGNOSIS — F419 Anxiety disorder, unspecified: Secondary | ICD-10-CM

## 2022-04-08 DIAGNOSIS — E119 Type 2 diabetes mellitus without complications: Secondary | ICD-10-CM

## 2022-04-08 DIAGNOSIS — I10 Essential (primary) hypertension: Secondary | ICD-10-CM

## 2022-04-08 DIAGNOSIS — F32A Depression: Secondary | ICD-10-CM

## 2022-04-08 DIAGNOSIS — G43109 Migraine with aura, not intractable, without status migrainosus: Secondary | ICD-10-CM

## 2022-04-08 MED ORDER — TOPIRAMATE 25 MG PO TAB
ORAL_TABLET | 5 refills | Status: AC
Start: 2022-04-08 — End: ?

## 2022-04-08 NOTE — Patient Instructions
Samantha Valencia 26 y.o. female with history of anxiety, DM, PCOS, HTN and migraines who presented to neurology for evaluation of migraines.     1. Migraine with aura and without status migrainosus, not intractable       Tried medication   - Topiramate tried without relief (08/2021-10/2021)  - Not candidate for propranolol or TCAs    Diagnotic work up  - MRI brian wo/w contrast on 10/2020: Normal MRI of the brain. Trace left mastoid fluid.     RECOMMENDATIONS  - Continue adequate hydration to avoid dehydration related headache.   - Continue magnesium oxide 400-500mg  daily and riboflavin 400mg  daily.   - Increase topiramate to 75mg  twice daily.  - If this doesn't help next step is botox injection.   - For severe migraine continue sumatriptan 50mg . Dose could be repeated in 2 hours of no relief. Max of 2 tablets per day. Total of 9 tablets per months     Orders Placed This Encounter    topiramate (TOPAMAX) 25 mg tablet       FOLLOWUP PLAN  Return in about 6 months (around 10/08/2022).  The patient is instructed to contact me if there are any concerns with the agreed plan.      -- Preferred method of communication is through [employer], if the issue cannot wait until your next scheduled follow up.   -- MyChart may be used for non-emergent communication. Emails are not reviewed after hours or over the weekend/holidays/after 4PM. Staff will reply to your email within 24-48 business hours.       -- If you do not hear from [place] within one week of a lab or imaging study being completed, please call/send my chart email to the office to be sure that we have received the results. This is especially challenging when tests are done outside of the [facility] system, as many times results do not make it back to our office for a variety of reasons. In our office no news is good news does not apply. You should hear from [place] with results for each test.    -- If you are having acute (new/sudden onset) or severe/worsening neurologic symptoms, please call 911 or seek care in ED.    -- For scheduling of IMAGING/RADIOLOGY, please call [phone] at your convenience to schedule your studies.  -- For referrals placed during the visit, if you have not heard from scheduling within one week, please call the call center at [phone] to get scheduling assistance.  -- For refills on medications, please first contact your pharmacy, who will fax a refill authorization request form to our office.  Weekdays only. Allow up to 2 business days for refills. Please plan ahead, as refills will not be filled after hours.  -- Our front desk staff, Selena Batten or Marcelino Duster may be reached at [phone] for scheduling needs.   -- Heather RN, may be contacted at [phone] for urgent needs. Staff will return your call within 24 business hours.     For Appointments:   -- Please try to arrive early for your appointment time to help facilitate your visit. 15 minutes early is recommended.   -- If you are late to your appointment, we reserve the right to ask you to reschedule or wait until next available time to be seen in fairness to other patients scheduled that day.   -- There are times when we are running behind in clinic. Our goal is to always be on time, however, there are  time when unexpected events occur with patients, which may cause a delay. We appreciate your understanding when this occurs.

## 2022-04-08 NOTE — Progress Notes
Obtained patient's verbal consent to treat them and their agreement to [facility] financial policy and NPP via this telehealth visit during the [facility] Emergency    Date of Service: 04/08/2022    Subjective:             Samantha Valencia is a 26 y.o. female with migraines.      History of Present Illness  Samantha Valencia 26 y.o. female with history of anxiety, DM, PCOS, HTN and migraines who presented to neurology for follow up of migraines.     Patient was last seen in clinic on 09/2021 for migraine headache and was started on magnesium, riboflavin and topiramate. She is also on PRN sumatriptan. She has been taking topiramate 50mg  twice daily and getting about 2-3 days a week. The headaches are lasting about 4-5 hours. These is improvement from everyday headache lasting several days. The headaches are located in the posterior right of the head. She has associated photophobia, phonophobia and nausea. She has been taking magnesium and riboflavin. The sumatriptan has been helpful.     Review of Records:       Medical History:   Diagnosis Date   ? Anxiety    ? Depression    ? Diabetes (HCC)    ? History of PCOS    ? Hypertension    ? Lymphedema          History reviewed. No pertinent surgical history.      Social History     Socioeconomic History   ? Marital status: Single   Tobacco Use   ? Smoking status: Never   ? Smokeless tobacco: Never   Substance and Sexual Activity   ? Alcohol use: Never   ? Drug use: Never         Family History   Problem Relation Age of Onset   ? Bipolar Disorder Mother    ? Schizophrenia Father        No Known Allergies         Review of Systems      Objective:         ? cetirizine (ZYRTEC) 10 mg tablet Take one tablet by mouth at bedtime daily.   ? cholecalciferol (vitamin D3) (VITAMIN D3 PO) Take 2 Gummy by mouth daily. 2 gummies = 2000 IU   ? diphenhydrAMINE HCL (BENADRYL) 25 mg capsule Take one capsule by mouth every 8 hours.   ? empagliflozin (JARDIANCE) 10 mg tablet Take one tablet by mouth daily.   ? fluoxetine (PROZAC) 40 mg capsule Take one capsule by mouth at bedtime daily.   ? fluticasone propionate (FLONASE) 50 mcg/actuation nasal spray, suspension Apply two sprays to each nostril as directed daily as needed. Shake bottle gently before using.   ? furosemide (LASIX) 20 mg tablet Take one tablet by mouth every morning.   ? gabapentin (NEURONTIN) 100 mg capsule Take one capsule by mouth DAILY PRN.   ? glipiZIDE (GLUCOTROL) 10 mg tablet Take one tablet by mouth twice daily.   ? hydroCHLOROthiazide (HYDRODIURIL) 25 mg tablet Take one tablet by mouth every morning.   ? hydrOXYzine HCL (ATARAX) 25 mg tablet Take one tablet by mouth twice daily. Indications: anxious   ? ibuprofen (ADVIL) 200 mg tablet Take two tablets by mouth every 12 hours as needed for Pain. Take with food.   ? ibuprofen (MOTRIN) 800 mg tablet Take one tablet by mouth every 8 hours as needed for Pain. Take with food.   ? insulin  aspart (U-100) (NOVOLOG FLEXPEN U-100 INSULIN) 100 unit/mL (3 mL) PEN Inject five Units under the skin three times daily with meals.   ? insulin detemir U-100(+) (LEVEMIR) 100 unit/mL vial Inject thirty four Units under the skin at bedtime daily.   ? losartan (COZAAR) 50 mg tablet Take two tablets by mouth at bedtime daily.   ? Magnesium 250 mg tab Take one tablet by mouth daily.   ? medroxyPROGESTERone (DEPO-PROVERA) 150 mg/mL injection Inject 1 mL into the muscle every 90 days.   ? melatonin 5 mg chew Chew 3 Gummy by mouth at bedtime as needed.   ? metFORMIN (GLUCOPHAGE) 1,000 mg tablet Take one tablet by mouth twice daily with meals.   ? methylPREDNIsolone (MEDROL (PAK)) 4 mg tablet Take medication as directed on package for 6 days. Take with food.   ? metoprolol tartrate 25 mg tablet Take one tablet by mouth twice daily.   ? omeprazole DR (PRILOSEC) 20 mg capsule Take one capsule by mouth daily before breakfast.   ? other medication Medication Name & Strength: [place] Probiotic Multi-Enzyme Digestive Formula Tablets    Dose(how many): 3 tablets    Frequency(how often): daily   ? other medication Medication Name & Strength: AZO Yeast Plus    Dose(how many): 1 tablet    Frequency(how often): three times daily as needed   ? PROAIR HFA 90 mcg/actuation inhaler Inhale two puffs by mouth into the lungs every 4 hours as needed.   ? prochlorperazine maleate (COMPAZINE) 10 mg tablet Take one tablet by mouth every 8 hours as needed for Other... (take with benadryl as needed for migraine headache).   ? riboflavin (vitamin B2) 400 mg tablet Take one tablet by mouth daily.   ? semaglutide (OZEMPIC SC) Inject  under the skin.   ? SITagliptin (JANUVIA) 100 mg tablet Take one tablet by mouth daily.   ? SUMAtriptan succinate (IMITREX) 100 mg tablet Take one tablet by mouth at onset of headache. May repeat after 2 hours if needed. Max of 200 mg in 24 hours.   ? tizanidine HCl (TIZANIDINE PO) Take  by mouth as Needed.   ? topiramate (TOPAMAX) 25 mg tablet 75mg  in the morning and night.     There were no vitals filed for this visit.  There is no height or weight on file to calculate BMI.     Physical Exam  General: no acute distress, cooperative    HEENT: normocephalic, atraumatic,     Neurological Exam   Mental status: Alert and oriented to time, place, person and situation.  Speech: Fluent, with normal naming, comprehension, articulation and repetition       Assessment and Plan:  Samantha Valencia 26 y.o. female with history of anxiety, DM, PCOS, HTN and migraines who presented to neurology for evaluation of migraines.     1. Migraine with aura and without status migrainosus, not intractable       Tried medication   - Topiramate tried without relief (08/2021-10/2021)  - Not candidate for propranolol or TCAs    Diagnotic work up  - MRI brian wo/w contrast on 10/2020: Normal MRI of the brain. Trace left mastoid fluid.     RECOMMENDATIONS  - Continue adequate hydration to avoid dehydration related headache.   - Continue magnesium oxide 400-500mg  daily and riboflavin 400mg  daily.   - Increase topiramate to 75mg  twice daily.  - If this doesn't help next step is botox injection.   - For severe migraine continue sumatriptan  50mg . Dose could be repeated in 2 hours of no relief. Max of 2 tablets per day. Total of 9 tablets per months     Orders Placed This Encounter   ? topiramate (TOPAMAX) 25 mg tablet       FOLLOWUP PLAN  Return in about 6 months (around 10/08/2022).  The patient is instructed to contact me if there are any concerns with the agreed plan.        Total time 30 minutes.  Estimated counseling time was more than 50% of the visit time. Counseled patient regarding migraine headache and preventive treatment.       Linden Dolin, MD  Clinical Assistant Professor   [employer] [facility]

## 2022-05-12 IMAGING — CR [ID]
4 series · 4 of 4 positions shown · non-contrast
Comparison: none

[cspine lat]
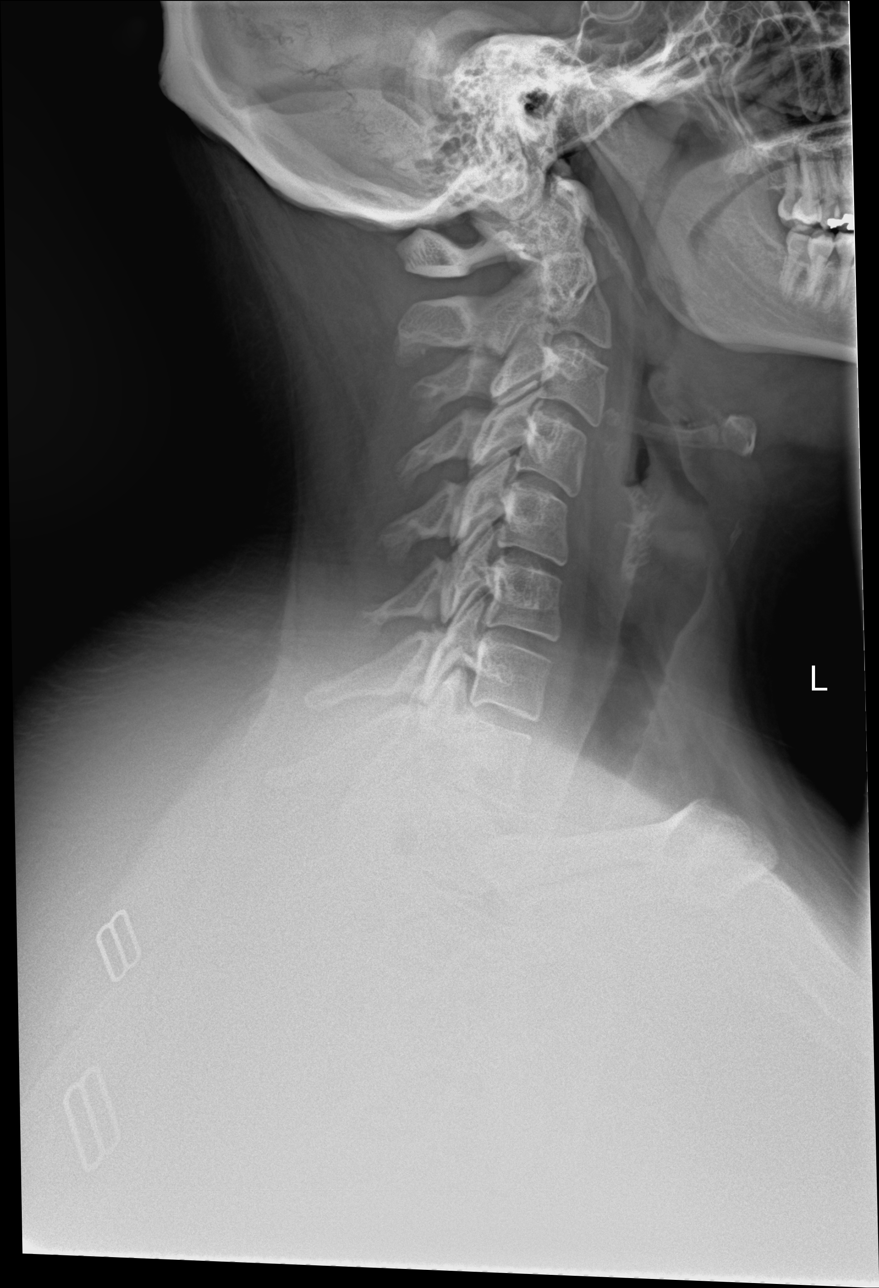

[cspine swimmers]
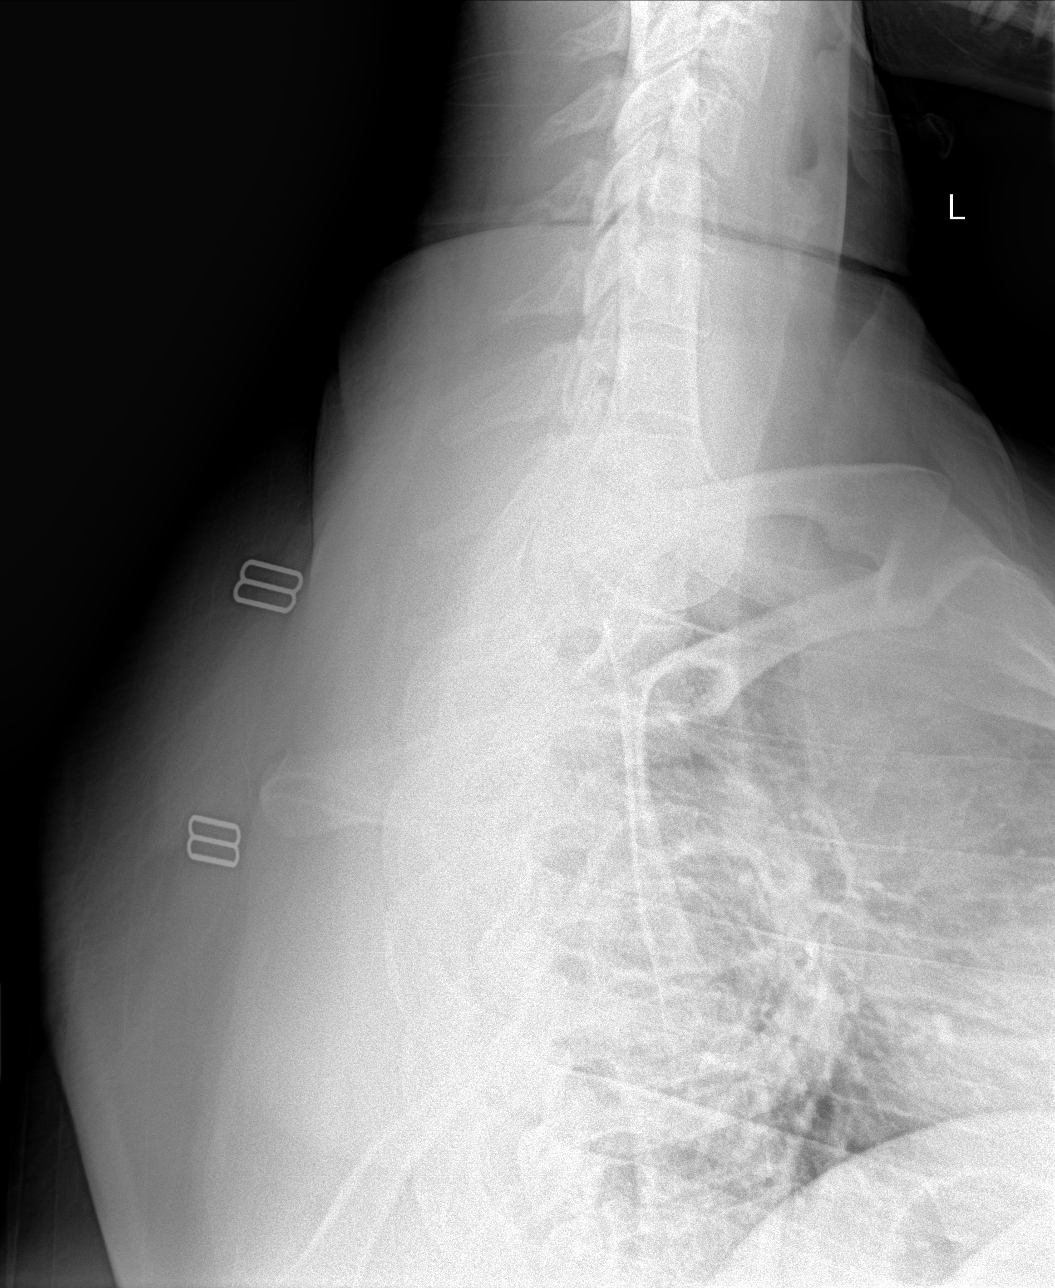

[cspine ap]
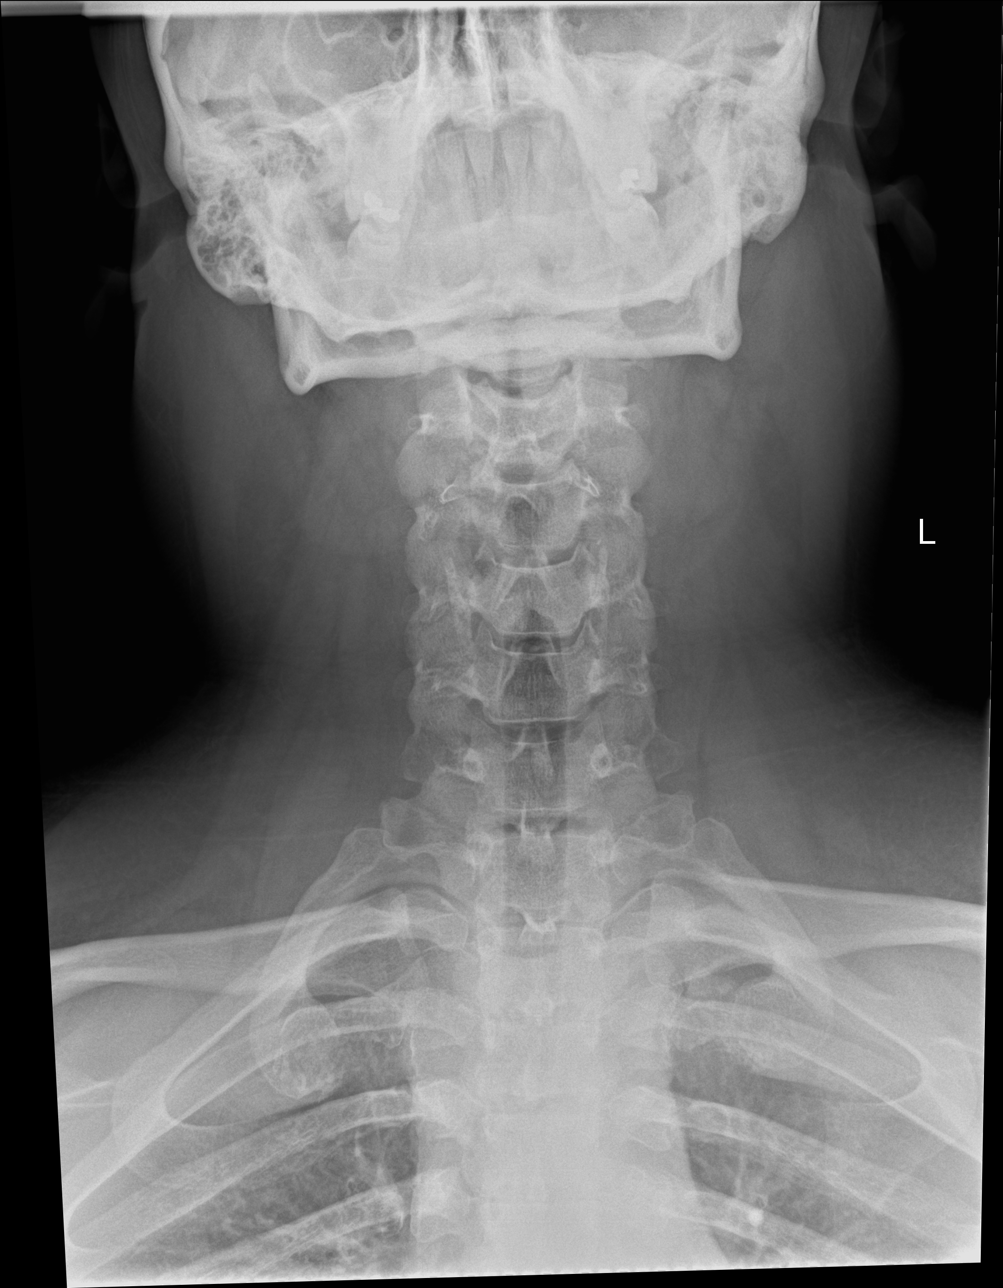

[cspine odontoid]
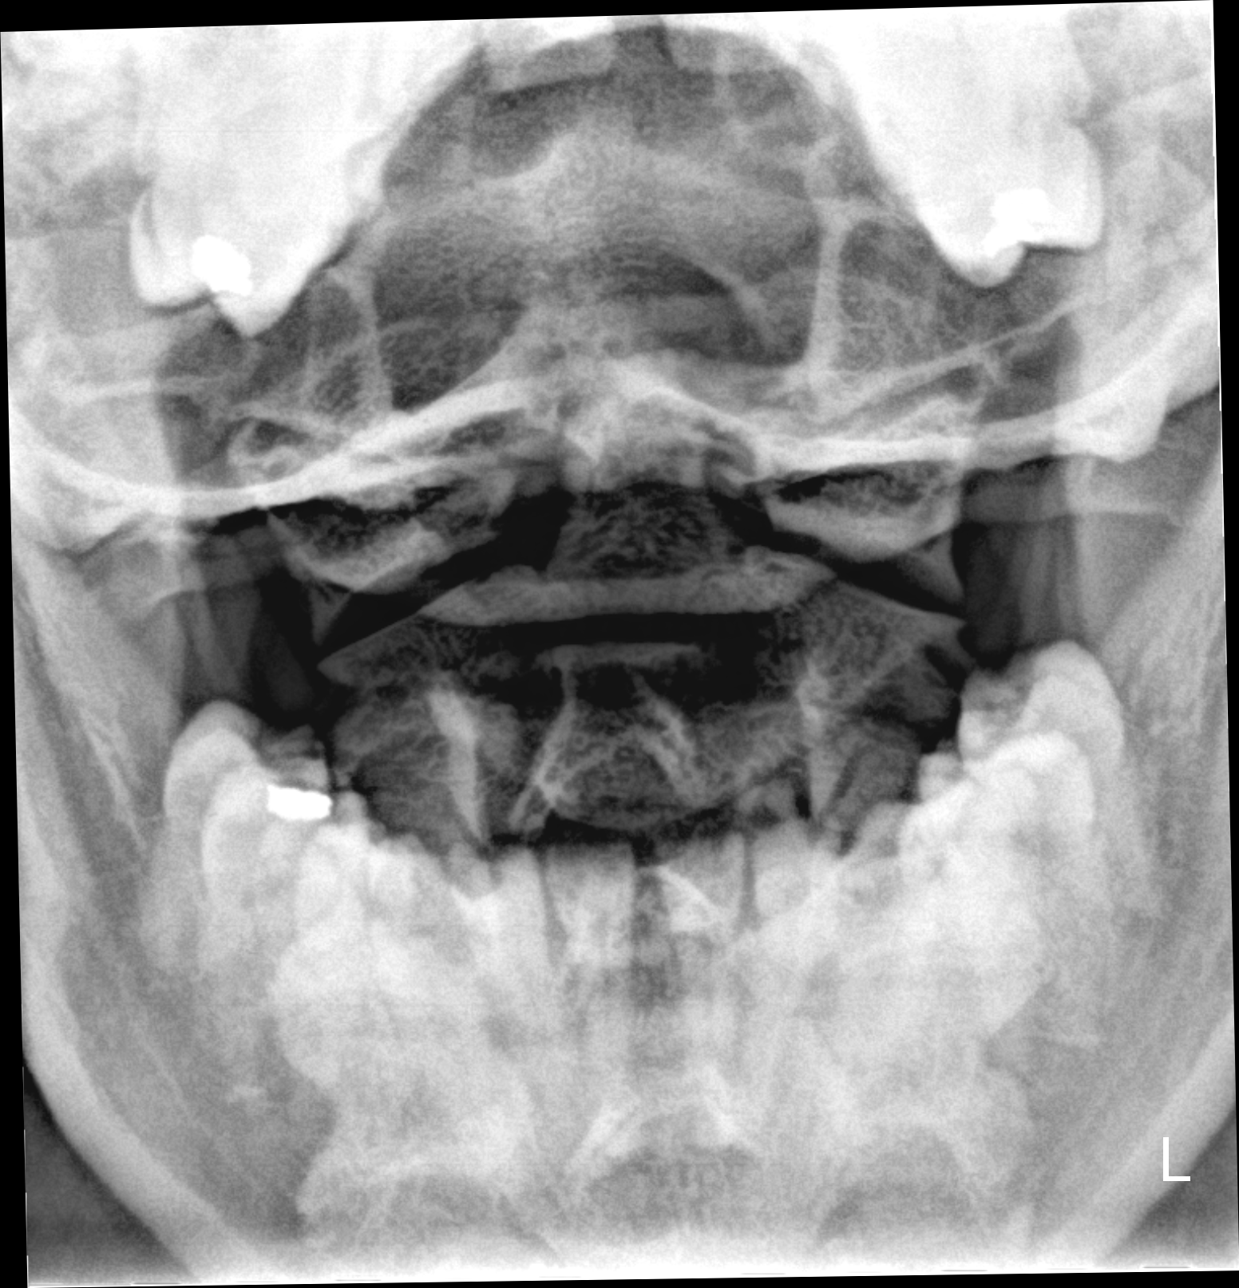

[4 of 4 positions shown; findings below may reference images not displayed]

EXAM

XR cervical spine 2V

INDICATION

Cervicalgia
cervicalgia; migraines x1 month

TECHNIQUE

XR cervical spine 2V

COMPARISONS

None available

FINDINGS

There is kyphosis of the cervical spine centered at C5. No fracture or listhesis. The
intervertebral disc heights are maintained. Odontoid is intact.

IMPRESSION

Loss of normal cervical lordosis which may relate to patient positioning or muscle spasm. No acute
osseous findings.

Tech Notes:

cervicalgia; migraines x1 month

## 2022-07-01 ENCOUNTER — Encounter: Admit: 2022-07-01 | Discharge: 2022-07-01 | Primary: Family

## 2022-11-04 ENCOUNTER — Encounter: Admit: 2022-11-04 | Discharge: 2022-11-04 | Primary: Family

## 2022-11-04 ENCOUNTER — Ambulatory Visit: Admit: 2022-11-04 | Discharge: 2022-11-05 | Primary: Family

## 2022-11-04 DIAGNOSIS — I890 Lymphedema, not elsewhere classified: Secondary | ICD-10-CM

## 2022-11-04 DIAGNOSIS — F419 Anxiety disorder, unspecified: Secondary | ICD-10-CM

## 2022-11-04 DIAGNOSIS — Z8742 Personal history of other diseases of the female genital tract: Secondary | ICD-10-CM

## 2022-11-04 DIAGNOSIS — E119 Type 2 diabetes mellitus without complications: Secondary | ICD-10-CM

## 2022-11-04 DIAGNOSIS — I10 Essential (primary) hypertension: Secondary | ICD-10-CM

## 2022-11-04 DIAGNOSIS — F32A Depression: Secondary | ICD-10-CM

## 2022-11-04 MED ORDER — TOPIRAMATE 50 MG PO TAB
ORAL_TABLET | 5 refills | Status: AC
Start: 2022-11-04 — End: ?

## 2022-12-29 ENCOUNTER — Encounter: Admit: 2022-12-29 | Discharge: 2022-12-29 | Payer: BC Managed Care – PPO | Primary: Family

## 2022-12-29 ENCOUNTER — Emergency Department: Admit: 2022-12-29 | Discharge: 2022-12-29 | Payer: BC Managed Care – PPO

## 2022-12-29 DIAGNOSIS — R519 Nonintractable headache, unspecified chronicity pattern, unspecified headache type: Secondary | ICD-10-CM

## 2022-12-29 DIAGNOSIS — R42 Dizziness and giddiness: Secondary | ICD-10-CM

## 2022-12-29 LAB — COMPREHENSIVE METABOLIC PANEL
CHLORIDE: 103 MMOL/L (ref 98–110)
POTASSIUM: 4 MMOL/L (ref 3.5–5.1)
SODIUM: 139 MMOL/L (ref 137–147)

## 2022-12-29 LAB — URINALYSIS MICROSCOPIC REFLEX TO CULTURE

## 2022-12-29 LAB — POC GLUCOSE: POC GLUCOSE: 113 mg/dL — ABNORMAL HIGH (ref 70–100)

## 2022-12-29 LAB — URINALYSIS DIPSTICK REFLEX TO CULTURE
NITRITE: NEGATIVE
URINE ASCORBIC ACID, UA: NEGATIVE
URINE BILE: NEGATIVE
URINE BLOOD: NEGATIVE

## 2022-12-29 LAB — CBC AND DIFF: WBC COUNT: 16 K/UL — ABNORMAL HIGH (ref 4.5–11.0)

## 2022-12-29 MED ORDER — PROCHLORPERAZINE EDISYLATE 5 MG/ML IJ SOLN
10 mg | Freq: Once | INTRAVENOUS | 0 refills | Status: CP
Start: 2022-12-29 — End: ?
  Administered 2022-12-30: 03:00:00 10 mg via INTRAVENOUS

## 2022-12-29 MED ORDER — SODIUM CHLORIDE 0.9 % IV SOLP
1000 mL | INTRAVENOUS | 0 refills | Status: CP
Start: 2022-12-29 — End: ?
  Administered 2022-12-30: 03:00:00 1000 mL via INTRAVENOUS

## 2022-12-29 MED ORDER — KETOROLAC 15 MG/ML IJ SOLN
15 mg | Freq: Once | INTRAVENOUS | 0 refills | Status: CP
Start: 2022-12-29 — End: ?
  Administered 2022-12-30: 03:00:00 15 mg via INTRAVENOUS

## 2022-12-29 MED ORDER — DIPHENHYDRAMINE HCL 50 MG/ML IJ SOLN
25 mg | Freq: Once | INTRAVENOUS | 0 refills | Status: CP
Start: 2022-12-29 — End: ?
  Administered 2022-12-30: 03:00:00 25 mg via INTRAVENOUS

## 2022-12-30 ENCOUNTER — Emergency Department: Admit: 2022-12-30 | Discharge: 2022-12-30 | Payer: BC Managed Care – PPO

## 2022-12-30 NOTE — Unmapped
You are seen in the emergency room for dizziness and headache today.  You may have been dizzy due to being in a warm shower or due to having a recent concussion.  I recommend staying hydrated, changing positions slowly, and please sit down if you start to feel dizzy.  I also recommend Tylenol and ibuprofen as needed for headaches.  Follow-up with your primary care provider.
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
## 2022-12-30 NOTE — ED Notes
Pt given discharge instructions. Verbalized understanding. Pt A&O x4. Spontaneous and unlabored breathing. PIV is discontinued catheter intact site dressed bleeding controlled. Pt ambulated to the waiting room. All belongings left with the patient.     Discussed:  Education: dizziness, headache   Follow up: PCP  Prescriptions: na  Return to ED Criteria: worsening of headache or dizziness

## 2023-01-31 ENCOUNTER — Inpatient Hospital Stay: Admit: 2023-01-31 | Discharge: 2023-01-31 | Payer: BC Managed Care – PPO | Primary: Family

## 2023-01-31 ENCOUNTER — Encounter: Admit: 2023-01-31 | Discharge: 2023-01-31 | Payer: BC Managed Care – PPO | Primary: Family

## 2023-01-31 ENCOUNTER — Emergency Department: Admit: 2023-01-31 | Discharge: 2023-01-31 | Payer: BC Managed Care – PPO

## 2023-01-31 ENCOUNTER — Inpatient Hospital Stay: Admit: 2023-01-31 | Payer: BC Managed Care – PPO

## 2023-01-31 DIAGNOSIS — L0390 Cellulitis, unspecified: Secondary | ICD-10-CM

## 2023-01-31 DIAGNOSIS — E13621 Other specified diabetes mellitus with foot ulcer: Secondary | ICD-10-CM

## 2023-01-31 DIAGNOSIS — L03115 Cellulitis of right lower limb: Secondary | ICD-10-CM

## 2023-01-31 DIAGNOSIS — R0789 Other chest pain: Secondary | ICD-10-CM

## 2023-01-31 DIAGNOSIS — A419 Sepsis, unspecified organism: Secondary | ICD-10-CM

## 2023-01-31 LAB — HIGH SENSITIVITY TROPONIN I 2 HOUR
HIGH SENSITIVITY TROPONIN I 2 HOUR: 4 ng/L — ABNORMAL HIGH (ref <12)
HIGH SENSITIVITY TROPONIN I DELTA VALUE: 1 g/dL (ref 3.5–5.0)

## 2023-01-31 LAB — CBC AND DIFF
HEMATOCRIT: 33 % — ABNORMAL LOW (ref 36–45)
HEMOGLOBIN: 11 g/dL — ABNORMAL LOW (ref 12.0–15.0)
MCV: 85 FL (ref 80–100)
RBC COUNT: 3.8 M/UL — ABNORMAL LOW (ref 4.0–5.0)
WBC COUNT: 14 K/UL — ABNORMAL HIGH (ref 4.5–11.0)

## 2023-01-31 LAB — PTT (APTT): PTT: 33 s (ref 24.0–36.5)

## 2023-01-31 LAB — GRAM STAIN

## 2023-01-31 LAB — PREGNANCY TEST-URINE
SP. GRAVITY: 1
URINE HCG: NEGATIVE

## 2023-01-31 LAB — SED RATE: ESR: 53 mm/h — ABNORMAL HIGH (ref 0–20)

## 2023-01-31 LAB — URINALYSIS MICROSCOPIC REFLEX TO CULTURE

## 2023-01-31 LAB — URINALYSIS DIPSTICK REFLEX TO CULTURE
NITRITE: NEGATIVE U/L (ref 7–40)
URINE ASCORBIC ACID, UA: NEGATIVE U/L — ABNORMAL HIGH (ref 7–56)

## 2023-01-31 LAB — COMPREHENSIVE METABOLIC PANEL: SODIUM: 139 MMOL/L (ref 137–147)

## 2023-01-31 LAB — HIGH SENSITIVITY TROPONIN I, RANDOM: HIGH SENSITIVITY TROPONIN I: 4 ng/L (ref <12)

## 2023-01-31 LAB — POC GLUCOSE
POC GLUCOSE: 128 mg/dL — ABNORMAL HIGH (ref 70–100)
POC GLUCOSE: 159 mg/dL — ABNORMAL HIGH (ref 70–100)
POC GLUCOSE: 116 mg/dL — ABNORMAL HIGH (ref 70–100)

## 2023-01-31 LAB — PROTIME INR (PT)
INR: 1.2 (ref 0.8–1.2)
PROTIME: 13 s (ref 9.5–14.2)

## 2023-01-31 LAB — C REACTIVE PROTEIN (CRP): C-REACTIVE PROTEIN: 4.6 mg/dL — ABNORMAL HIGH (ref <1.0)

## 2023-01-31 LAB — HIGH SENSITIVITY TROPONIN I 0 HOUR: HIGH SENSITIVITY TROPONIN I 0 HOUR: 3 ng/L (ref <12)

## 2023-01-31 LAB — D-DIMER: D-DIMER: 440 ng{FEU}/mL — ABNORMAL HIGH (ref <500)

## 2023-01-31 MED ORDER — PIPERACILLIN/TAZOBACTAM 4.5 G/NS (MB+)(EXTENDED INFUSION)
4.5 g | INTRAVENOUS | 0 refills | Status: AC
Start: 2023-01-31 — End: ?
  Administered 2023-02-01 – 2023-02-02 (×12): 4.5 g via INTRAVENOUS

## 2023-01-31 MED ORDER — OXYCODONE 5 MG PO TAB
5 mg | Freq: Two times a day (BID) | ORAL | 0 refills | Status: AC | PRN
Start: 2023-01-31 — End: ?
  Administered 2023-02-01 – 2023-02-02 (×2): 5 mg via ORAL

## 2023-01-31 MED ORDER — PIPERACILLIN/TAZOBACTAM 4.5 G/100ML NS IVPB (MB+)
4.5 g | Freq: Once | INTRAVENOUS | 0 refills | Status: CP
Start: 2023-01-31 — End: ?
  Administered 2023-02-01 (×2): 4.5 g via INTRAVENOUS

## 2023-01-31 MED ORDER — GADOBENATE DIMEGLUMINE 529 MG/ML (0.1MMOL/0.2ML) IV SOLN
20 mL | Freq: Once | INTRAVENOUS | 0 refills | Status: CP
Start: 2023-01-31 — End: ?
  Administered 2023-02-01: 03:00:00 20 mL via INTRAVENOUS

## 2023-01-31 MED ORDER — VANCOMYCIN 2,500 MG IVPB
2500 mg | Freq: Once | INTRAVENOUS | 0 refills | Status: CP
Start: 2023-01-31 — End: ?
  Administered 2023-01-31 (×2): 2500 mg via INTRAVENOUS

## 2023-01-31 MED ORDER — LOSARTAN 50 MG PO TAB
100 mg | Freq: Every evening | ORAL | 0 refills | Status: AC
Start: 2023-01-31 — End: ?
  Administered 2023-02-01 – 2023-02-02 (×2): 100 mg via ORAL

## 2023-01-31 MED ORDER — AMPICILLIN/SULBACTAM 3G/100ML NS IVPB (MB+)
3 g | Freq: Once | INTRAVENOUS | 0 refills | Status: CP
Start: 2023-01-31 — End: ?
  Administered 2023-01-31 (×2): 3 g via INTRAVENOUS

## 2023-01-31 MED ORDER — SENNOSIDES-DOCUSATE SODIUM 8.6-50 MG PO TAB
1 | Freq: Every day | ORAL | 0 refills | Status: AC | PRN
Start: 2023-01-31 — End: ?

## 2023-01-31 MED ORDER — FUROSEMIDE 20 MG PO TAB
20 mg | Freq: Every morning | ORAL | 0 refills | Status: AC
Start: 2023-01-31 — End: ?
  Administered 2023-02-01 – 2023-02-02 (×2): 20 mg via ORAL

## 2023-01-31 MED ORDER — POLYETHYLENE GLYCOL 3350 17 GRAM PO PWPK
1 | Freq: Every day | ORAL | 0 refills | Status: AC | PRN
Start: 2023-01-31 — End: ?

## 2023-01-31 MED ORDER — VANCOMYCIN (15-40KG) IVPB
2000 mg | Freq: Once | INTRAVENOUS | 0 refills | Status: DC
Start: 2023-01-31 — End: 2023-01-31

## 2023-01-31 MED ORDER — ONDANSETRON 4 MG PO TBDI
4 mg | ORAL | 0 refills | Status: AC | PRN
Start: 2023-01-31 — End: ?

## 2023-01-31 MED ORDER — FUROSEMIDE 20 MG PO TAB
20 mg | Freq: Every morning | ORAL | 0 refills | Status: DC
Start: 2023-01-31 — End: 2023-02-01

## 2023-01-31 MED ORDER — ACETAMINOPHEN 500 MG PO TAB
1000 mg | ORAL | 0 refills | Status: AC | PRN
Start: 2023-01-31 — End: ?
  Administered 2023-02-01 – 2023-02-02 (×3): 1000 mg via ORAL

## 2023-01-31 MED ORDER — ACETAMINOPHEN 500 MG PO TAB
1000 mg | Freq: Once | ORAL | 0 refills | Status: CP
Start: 2023-01-31 — End: ?
  Administered 2023-01-31: 20:00:00 1000 mg via ORAL

## 2023-01-31 MED ORDER — FLUOXETINE 20 MG PO CAP
40 mg | Freq: Every evening | ORAL | 0 refills | Status: AC
Start: 2023-01-31 — End: ?
  Administered 2023-02-01 – 2023-02-02 (×2): 40 mg via ORAL

## 2023-01-31 MED ORDER — GABAPENTIN 100 MG PO CAP
100 mg | Freq: Every evening | ORAL | 0 refills | Status: AC
Start: 2023-01-31 — End: ?
  Administered 2023-02-01 – 2023-02-02 (×2): 100 mg via ORAL

## 2023-01-31 MED ORDER — VANCOMYCIN 2,000 MG IVPB
2000 mg | Freq: Two times a day (BID) | INTRAVENOUS | 0 refills | Status: AC
Start: 2023-01-31 — End: ?
  Administered 2023-02-01 – 2023-02-02 (×6): 2000 mg via INTRAVENOUS

## 2023-01-31 MED ORDER — MELATONIN 5 MG PO TAB
5 mg | Freq: Every evening | ORAL | 0 refills | Status: AC | PRN
Start: 2023-01-31 — End: ?

## 2023-01-31 MED ORDER — VANCOMYCIN PHARMACY TO MANAGE
1 | 0 refills | Status: AC
Start: 2023-01-31 — End: ?

## 2023-01-31 MED ORDER — INSULIN ASPART 100 UNIT/ML SC FLEXPEN
0-6 [IU] | Freq: Before meals | SUBCUTANEOUS | 0 refills | Status: AC
Start: 2023-01-31 — End: ?

## 2023-01-31 MED ORDER — ONDANSETRON HCL (PF) 4 MG/2 ML IJ SOLN
4 mg | INTRAVENOUS | 0 refills | Status: AC | PRN
Start: 2023-01-31 — End: ?

## 2023-01-31 MED ORDER — PATIENTS OWN MEDICATION
1 | 0 refills | Status: AC
Start: 2023-01-31 — End: ?

## 2023-01-31 MED ORDER — MONTELUKAST 10 MG PO TAB
10 mg | Freq: Every evening | ORAL | 0 refills | Status: AC
Start: 2023-01-31 — End: ?
  Administered 2023-02-01 – 2023-02-02 (×2): 10 mg via ORAL

## 2023-01-31 MED ORDER — TOPIRAMATE 25 MG PO TAB
100 mg | Freq: Every evening | ORAL | 0 refills | Status: AC
Start: 2023-01-31 — End: ?
  Administered 2023-02-01 – 2023-02-02 (×2): 100 mg via ORAL

## 2023-01-31 MED ORDER — METOPROLOL TARTRATE 25 MG PO TAB
12.5 mg | Freq: Two times a day (BID) | ORAL | 0 refills | Status: AC
Start: 2023-01-31 — End: ?
  Administered 2023-02-01 – 2023-02-02 (×2): 12.5 mg via ORAL

## 2023-01-31 MED ORDER — HYDROCHLOROTHIAZIDE 25 MG PO TAB
25 mg | Freq: Every morning | ORAL | 0 refills | Status: AC
Start: 2023-01-31 — End: ?
  Administered 2023-02-01 – 2023-02-02 (×2): 25 mg via ORAL

## 2023-01-31 MED ORDER — HYDROCHLOROTHIAZIDE 25 MG PO TAB
25 mg | Freq: Every morning | ORAL | 0 refills | Status: DC
Start: 2023-01-31 — End: 2023-02-01

## 2023-01-31 MED ORDER — PIPERACILLIN/TAZOBACTAM 4.5 G/100ML NS IVPB (MB+)
4.5 g | Freq: Once | INTRAVENOUS | 0 refills | Status: DC
Start: 2023-01-31 — End: 2023-01-31

## 2023-01-31 MED ORDER — PANTOPRAZOLE 40 MG PO TBEC
40 mg | Freq: Every day | ORAL | 0 refills | Status: AC
Start: 2023-01-31 — End: ?
  Administered 2023-02-01 – 2023-02-02 (×2): 40 mg via ORAL

## 2023-01-31 MED ORDER — DEXTROSE 50 % IN WATER (D50W) IV SYRG
12.5-25 g | INTRAVENOUS | 0 refills | Status: AC | PRN
Start: 2023-01-31 — End: ?

## 2023-01-31 MED ORDER — TOPIRAMATE 25 MG PO TAB
50 mg | Freq: Every day | ORAL | 0 refills | Status: AC
Start: 2023-01-31 — End: ?
  Administered 2023-02-01 – 2023-02-02 (×2): 50 mg via ORAL

## 2023-01-31 MED ORDER — INSULIN GLARGINE 100 UNIT/ML (3 ML) SC INJ PEN
20 [IU] | Freq: Every evening | SUBCUTANEOUS | 0 refills | Status: AC
Start: 2023-01-31 — End: ?
  Administered 2023-02-01: 03:00:00 20 [IU] via SUBCUTANEOUS

## 2023-01-31 MED ORDER — ENOXAPARIN 40 MG/0.4 ML SC SYRG
40 mg | Freq: Every day | SUBCUTANEOUS | 0 refills | Status: AC
Start: 2023-01-31 — End: ?
  Administered 2023-02-01: 01:00:00 40 mg via SUBCUTANEOUS

## 2023-01-31 NOTE — H&P (View-Only)
Admission History and Physical Examination      Name: Samantha Valencia   MRN: [id number]     DOB: Dec 20, 1995      Age: 27 y.o.  Admission Date: 01/31/2023     LOS: 0 days     Date of Service: 01/31/2023                        Assessment/Plan:    Principal Problem:    Cellulitis of right foot  Active Problems:    Type 2 diabetes mellitus with hyperglycemia, with long-term current use of insulin (HCC)    Cellulitis of right lower extremity    Chest pain of uncertain etiology    Samantha Valencia is 27 y.o. female with a past medical history of T2DM, HTN, PCOS, migraines, and anxiety who was admitted with a right foot wound.     Right foot wound   Sepsis (2/4 SIRS criteria)  Leukocytosis   History of MRSA facial cellulitis/myositis   - Food wound is chronic but started getting wore with increasing discharge 4/1   - Was hospitalized 05/2021 and grew pan susceptible staph aureus   - Presented to the ED with bleeding from callus/wound site and redness of the right lower extremity  - ESR and CRP elevated. WBC elevated at 14.3 with left shift   - Given ampicillin and vancomycin in the ED   Plan:   > MRI of the right foot to evaluate for osteomyelitis   > Vancomycin and Zosyn. Would like to cover for pseudomonas   > Foot wound culture obtained   > Tylenol for pain control   > Follow blood cultures   > UA ordered    Chest pain   - Reported left chest pain and SOA on exertion which was new since chest pain started in the morning on the day of admission. Pain reproducible on palpation   - D dimer and troponin  negative   Plan:   > Continue to monitor     Recent abdominal pain   Diarrhea   - Patient reports loose stool the last few days   - Severe abdominal pain which she associated with ovarian cysts occurred 1 week prior to admission   Plan:   > Continue to monitor   > Consider pelvic US if concerned about ovarian pathology   > Specimen in lab Hcg     DM2  - Follows with [facility] end[facility]   - Most recent A1c is 5.5%   - PTA medications include empagliflozin 10 mg q day, glipizide 10 mg q day, lantus 25-30 unit qhs, lispro 9-11 units tid with meals, metformin 1000 mg bid  Plan:   > Glargine 20 units qhs   > LDCF   > POM ordered for semaglutide   > Holding all other po antihyperglycemics   > Request outpatient follow up with [facility] (ordered)     HTN   - PTA losartan 50 mg q day, metoprolol tartrate 12.5 mg bid, and HCTZ 25 mg q day, furosemide 20 mg q day   Plan:   > Continue losartan, metoprolol tartrate, HCTZ, and furosemide       Migraines   - PTA gabapentin, topiramate 50 mg qam and 100 mg qhs, sumitriptan   Plan:   > Continue PTA gabapentin and topiramate     Allergies   - PTA monteleukast and cetirizine   Plan:   > Continue PTA monteleukast 10 mg  GERD   - PTA omeprazole 20 mg q day   Plan:   > Pantoprazole 40 mg q day     MDD  Anxiety  - PTA fluoxetine 40 mg q day   Plan:   > Continue PTA fluoxetine     FEN: Regular diet, replace lytes prn, no IVF   PPx: Lovenox   Code Status: Full Code     Dispo: Admit to Med 3     Patient was seen and discussed with Dr. Archie Balboa.         _____________________________________________________________________________    Primary Care Physician: Samantha Valencia  Verified    Chief Complaint:  foot wound  History of Present Illness: Samantha Valencia is a 27 y.o. female who presents today with a foot wound and right lower extremity redness.     She reports ongoing right lower extremity callus on the plantar aspect of her right foot which she has had for several months.  She reports that she noticed some blood between her toes and realized that the callus must have opened up.  No significant pain.  She does report redness of her right lower extremity involving the calf and the whole back of her leg tracking up higher than the knee.  She states that this started approximately Friday.  She does notice some redness of the right lower extremity which is now more swollen than her left lower extremity, which is the leg is usually more swollen.  At baseline she has some mild nonpitting bilateral lower extremity edema.    She does report some fevers and chills which have been ongoing since Friday.    She does have a history of cellulitis involving her face.  She was hospitalized in August 2022 with cellulitis/myositis that started with a pimple.  She grew MSSA and was effectively treated without return of symptoms.  She reports that she also had one episode of needing to be hospitalized for skin infection when she was in approximately second grade.  She cannot recall if this was MRSA or not.    She follows with an outside endocrinologist and saw them yesterday.  There were no medication changes at this appointment.  She takes insulin, glipizide, Jardiance, metformin, and a weekly injection of Ozempic.  She was due for an injection today which she was not able to get because she was coming to the hospital.  She did take all of her blood pressure medications.  She reports that she does take furosemide, metoprolol, hydrochlorothiazide, and losartan for blood pressure control.    She reports mild loose bowel movements since Saturday.  She feels that she may not be completely digesting her food and has noticed the vegetables that she has been eating go through without being fully digested.  She denies any nausea or vomiting.    She did endorse significant abdominal pain a week prior to admission.  She notes that this felt like when she has previously had issues with ovarian cysts.  She does have a history of polycystic ovarian syndrome.  She has not had significant issues with painful cysts since she was in her teens.  She uses Depo progesterone injections for management of PCOS.  Last received a Depo injection a week prior to admission.    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  She lives in [place].    Medical History:   Diagnosis Date Anxiety     Depression     Diabetes (HCC)  History of PCOS     Hypertension     Lymphedema      No past surgical history on file.  Family history reviewed; non-contributory  Social History     Tobacco Use    Smoking status: Never    Smokeless tobacco: Never   Substance and Sexual Activity    Alcohol use: Never    Drug use: Never             Immunizations (includes history and patient reported):   Immunization History   Administered Date(s) Administered    COVID-19 ([employer]) recombinant protein vacc, 0.5 mL (PF) 06/24/2020, 09/16/2020           Allergies:  Patient has no known allergies.    Medications:  (Not in a hospital admission)   Review of Systems:  A comprehensive review of systems is negative except as noted in the HPI.    Physical Exam:  Vital Signs: Last Filed In 24 Hours Vital Signs: 24 Hour Range   BP: 132/74 (04/02 1500)  Temp: 36.8 ?C (98.2 ?F) (04/02 1009)  Pulse: 107 (04/02 1500)  Respirations: 20 PER MINUTE (04/02 1500)  SpO2: 98 % (04/02 1500)  O2 Device: None (Room air) (04/02 1132)  Height: 172.7 cm (5' 8) (04/02 1009) BP: (110-159)/(70-94)   Temp:  [36.8 ?C (98.2 ?F)]   Pulse:  [88-107]   Respirations:  [16 PER MINUTE-28 PER MINUTE]   SpO2:  [95 %-99 %]   O2 Device: None (Room air)   Intensity Pain Scale (Self Report): 5 (01/31/23 1028)      Physical Exam  Constitutional:       General: She is not in acute distress.     Appearance: Normal appearance.   HENT:      Head: Normocephalic and atraumatic.   Eyes:      Extraocular Movements: Extraocular movements intact.   Cardiovascular:      Pulses: Normal pulses.      Heart sounds: Normal heart sounds.   Pulmonary:      Effort: Pulmonary effort is normal. No respiratory distress.      Breath sounds: Normal breath sounds. No wheezing or rhonchi.   Abdominal:      General: Bowel sounds are normal.      Palpations: Abdomen is soft.   Musculoskeletal:      Right lower leg: Edema present.      Left lower leg: Edema present. Comments: Right greater than left    Skin:     Findings: Lesion present.      Comments: Foot wound on plantar aspect of right foot    Neurological:      General: No focal deficit present.      Mental Status: She is alert and oriented to person, place, and time.                Lab/Radiology/Other Diagnostic Tests:  24-hour labs:    Results for orders placed or performed during the hospital encounter of 01/31/23 (from the past 24 hour(s))   POC GLUCOSE    Collection Time: 01/31/23 11:28 AM   Result Value Ref Range    Glucose, POC 128 (H) 70 - 100 MG/DL   CBC AND DIFF    Collection Time: 01/31/23 11:28 AM   Result Value Ref Range    White Blood Cells 14.3 (H) 4.5 - 11.0 K/UL    RBC 3.88 (L) 4.0 - 5.0 M/UL    Hemoglobin 11.2 (L) 12.0 -  15.0 GM/DL    Hematocrit 16.1 (L) 36 - 45 %    MCV 85.1 80 - 100 FL    MCH 29.0 26 - 34 PG    MCHC 34.0 32.0 - 36.0 G/DL    RDW 09.6 11 - 15 %    Platelet Count 301 150 - 400 K/UL    MPV 7.9 7 - 11 FL    Neutrophils 84 (H) 41 - 77 %    Lymphocytes 9 (L) 24 - 44 %    Monocytes 5 4 - 12 %    Eosinophils 2 0 - 5 %    Basophils 0 0 - 2 %    Absolute Neutrophil Count 11.99 (H) 1.8 - 7.0 K/UL    Absolute Lymph Count 1.24 1.0 - 4.8 K/UL    Absolute Monocyte Count 0.69 0 - 0.80 K/UL    Absolute Eosinophil Count 0.30 0 - 0.45 K/UL    Absolute Basophil Count 0.05 0 - 0.20 K/UL    MDW (Monocyte Distribution Width) 21.9 (H) <20.7   C REACTIVE PROTEIN (CRP)    Collection Time: 01/31/23 11:28 AM   Result Value Ref Range    C-Reactive Protein 4.62 (H) <1.0 MG/DL   SED RATE    Collection Time: 01/31/23 11:28 AM   Result Value Ref Range    Sed Rate -ESR 53 (H) 0 - 20 MM/HR   COMPREHENSIVE METABOLIC PANEL    Collection Time: 01/31/23 11:28 AM   Result Value Ref Range    Sodium 139 137 - 147 MMOL/L    Potassium 3.9 3.5 - 5.1 MMOL/L    Chloride 107 98 - 110 MMOL/L    Glucose 121 (H) 70 - 100 MG/DL    Blood Urea Nitrogen 12 7 - 25 MG/DL    Creatinine 0.45 0.4 - 1.00 MG/DL    Calcium 9.2 8.5 - 40.9 MG/DL Total Protein 7.6 6.0 - 8.0 G/DL    Total Bilirubin 0.6 0.3 - 1.2 MG/DL    Albumin 4.1 3.5 - 5.0 G/DL    Alk Phosphatase 36 25 - 110 U/L    AST (SGOT) 11 7 - 40 U/L    CO2 23 21 - 30 MMOL/L    ALT (SGPT) 18 7 - 56 U/L    Anion Gap 9 3 - 12    eGFR >60 >60 mL/min   NT-PRO-BNP    Collection Time: 01/31/23 11:28 AM   Result Value Ref Range    NT-Pro-BNP 201.0 (H) <125 pg/mL   HIGH SENSITIVITY TROPONIN I 0 HOUR    Collection Time: 01/31/23 11:28 AM   Result Value Ref Range    hs Troponin I 0 Hour 3 <12 ng/L   D-DIMER    Collection Time: 01/31/23 11:28 AM   Result Value Ref Range    D-Dimer 440 <500 ng/mL FEU   PTT (APTT)    Collection Time: 01/31/23 11:28 AM   Result Value Ref Range    APTT 33.0 24.0 - 36.5 SEC   PROTIME INR (PT)    Collection Time: 01/31/23 11:28 AM   Result Value Ref Range    Protime 13.0 9.5 - 14.2 SEC    INR 1.2 0.8 - 1.2   HIGH SENSITIVITY TROPONIN I 2 HOUR    Collection Time: 01/31/23  1:51 PM   Result Value Ref Range    hs Troponin I 2 Hour 4 <12 ng/L    hs Troponin I 0-2hr Delta Value 1  Glucose: (!) 121 (01/31/23 1128)  POC Glucose (Download): (!) 128 (01/31/23 1128)  Pertinent radiology reviewed.    Lynnea Maizes, MD

## 2023-01-31 NOTE — ED Provider Notes
Samantha Valencia is a 27 y.o. female.    Chief Complaint:  Chief Complaint   Patient presents with    Wound Check     Patient has a diabetic foot wound and states the posterior leg and calf have started to have tenderness and she states she had redness this morning.    Chest Pain     States she started having chest pains about 1 hour ago (did not mention this until triage).       History of Present Illness:  Samantha Valencia is a 27 y.o. female, with a history of anxiety, depression, DM, PCOS, HTN, lymphedema who presents to the emergency department for wound check and chest pain. Patient reports she has had a callus on the bottom of her right foot but noticed it turned into a wound and began draining a couple days ago. She states she saw her endocrinologist yesterday in clinic and they referred her to wound care but woke up this morning with redness and pain extending up her leg. She also endorses chills but denies fevers. Of note, the patient reports having reduced sensation in her feet at baseline and mentions having a history of MRSA. Otherwise, she endorses a recent slight cough and then had an onset of substernal chest pain and slight shortness of breath while en route to the ER. Right now, she ranks her chest pain at 4/10. Denies recent long trips or travel. Denies history of blood clots. Denies family history of heart attack.  Her last Tdap was in 2023.      History provided by:  Patient and medical records  Language interpreter used: No    Chest Pain  Associated symptoms: cough (slight) and shortness of breath (slight)    Associated symptoms: no abdominal pain, no back pain, no fever and no headache        Review of Systems:  Review of Systems   Constitutional:  Positive for chills. Negative for fever.   HENT:  Negative for sore throat.    Eyes:  Negative for visual disturbance.   Respiratory:  Positive for cough (slight) and shortness of breath (slight).    Cardiovascular:  Positive for chest pain (substernal).   Gastrointestinal:  Negative for abdominal pain.   Genitourinary:  Negative for dysuria.   Musculoskeletal:  Negative for back pain.   Skin:  Positive for color change (redness extending up leg) and wound (bottom of right foot). Negative for rash.   Neurological:  Negative for headaches.       Allergies:  Patient has no known allergies.    Past Medical History:  Medical History:   Diagnosis Date    Anxiety     Depression     Diabetes (HCC)     History of PCOS     Hypertension     Lymphedema        Past Surgical History:  No past surgical history on file.    Pertinent medical/surgical history reviewed  Medical History:   Diagnosis Date    Anxiety     Depression     Diabetes (HCC)     History of PCOS     Hypertension     Lymphedema      No past surgical history on file.    Social History:  Social History     Tobacco Use    Smoking status: Never    Smokeless tobacco: Never   Substance Use Topics    Alcohol use: Never  Drug use: Never     Social History     Substance and Sexual Activity   Drug Use Never             Family History:  Family History   Problem Relation Age of Onset    Bipolar Disorder Mother     Schizophrenia Father        Vitals:  ED Vitals      Date and Time T BP P RR SPO2P SPO2 User   01/31/23 1500 -- 132/74 107 20 PER MINUTE -- 98 % RH   01/31/23 1433 -- -- -- 16 PER MINUTE -- -- RH   01/31/23 1430 -- 129/75 95 -- -- 97 % RH   01/31/23 1400 -- 131/89 94 25 PER MINUTE -- 99 % RH   01/31/23 1330 -- 110/94 100 22 PER MINUTE -- 95 % RH   01/31/23 1231 -- -- -- 19 PER MINUTE -- -- RH   01/31/23 1230 -- 141/70 88 -- -- 98 % RH   01/31/23 1200 -- 149/84 101 -- -- 98 % RH   01/31/23 1159 -- -- -- 28 PER MINUTE -- -- RH   01/31/23 1132 -- -- 105 23 PER MINUTE -- 98 % RH   01/31/23 1130 -- 159/91 96 -- -- 98 % RH   01/31/23 1009 36.8 ?C (98.2 ?F) 142/81 96 19 PER MINUTE -- 98 % JM            Physical Exam:  Physical Exam  Vitals and nursing note reviewed.   Constitutional:       Appearance: Normal appearance. She is obese.   HENT:      Head: Normocephalic and atraumatic.      Right Ear: External ear normal.      Left Ear: External ear normal.   Eyes:      Extraocular Movements: Extraocular movements intact.      Conjunctiva/sclera: Conjunctivae normal.   Cardiovascular:      Rate and Rhythm: Regular rhythm. Tachycardia present.      Pulses: Normal pulses.           Dorsalis pedis pulses are 2+ on the right side.      Heart sounds: Normal heart sounds.   Pulmonary:      Effort: Pulmonary effort is normal.      Breath sounds: Normal breath sounds. No wheezing, rhonchi or rales.   Chest:      Chest wall: Tenderness present.      Comments: Central and left sided reproducible tenderness.  Abdominal:      General: Abdomen is flat. Bowel sounds are normal. There is no distension.      Palpations: Abdomen is soft.      Tenderness: There is no abdominal tenderness.   Musculoskeletal:         General: Normal range of motion.      Cervical back: Normal range of motion and neck supple.   Feet:      Comments: See attached media. Ulcer noted to the bottom of the right foot near the 4th and 5th MTPs. No active drainage. Erythema and warmth extending to the proximal right calf. Sensory and range of motion intact.  Skin:     General: Skin is warm and dry.      Capillary Refill: Capillary refill takes less than 2 seconds.   Neurological:      General: No focal deficit present.      Mental Status: She is  alert and oriented to person, place, and time. Mental status is at baseline.   Psychiatric:         Mood and Affect: Mood normal.         Behavior: Behavior normal.               Laboratory Results:  Labs Reviewed   POC GLUCOSE - Abnormal       Result Value Ref Range Status    Glucose, POC 128 (*) 70 - 100 MG/DL Final   CBC AND DIFF - Abnormal    White Blood Cells 14.3 (*) 4.5 - 11.0 K/UL Final    RBC 3.88 (*) 4.0 - 5.0 M/UL Final    Hemoglobin 11.2 (*) 12.0 - 15.0 GM/DL Final    Hematocrit 16.1 (*) 36 - 45 % Final MCV 85.1  80 - 100 FL Final    MCH 29.0  26 - 34 PG Final    MCHC 34.0  32.0 - 36.0 G/DL Final    RDW 09.6  11 - 15 % Final    Platelet Count 301  150 - 400 K/UL Final    MPV 7.9  7 - 11 FL Final    Neutrophils 84 (*) 41 - 77 % Final    Lymphocytes 9 (*) 24 - 44 % Final    Monocytes 5  4 - 12 % Final    Eosinophils 2  0 - 5 % Final    Basophils 0  0 - 2 % Final    Absolute Neutrophil Count 11.99 (*) 1.8 - 7.0 K/UL Final    Absolute Lymph Count 1.24  1.0 - 4.8 K/UL Final    Absolute Monocyte Count 0.69  0 - 0.80 K/UL Final    Absolute Eosinophil Count 0.30  0 - 0.45 K/UL Final    Absolute Basophil Count 0.05  0 - 0.20 K/UL Final    MDW (Monocyte Distribution Width) 21.9 (*) <20.7 Final   C REACTIVE PROTEIN (CRP) - Abnormal    C-Reactive Protein 4.62 (*) <1.0 MG/DL Final   SED RATE - Abnormal    Sed Rate -ESR 53 (*) 0 - 20 MM/HR Final   COMPREHENSIVE METABOLIC PANEL - Abnormal    Sodium 139  137 - 147 MMOL/L Final    Potassium 3.9  3.5 - 5.1 MMOL/L Final    Chloride 107  98 - 110 MMOL/L Final    Glucose 121 (*) 70 - 100 MG/DL Final    Blood Urea Nitrogen 12  7 - 25 MG/DL Final    Creatinine 0.45  0.4 - 1.00 MG/DL Final    Calcium 9.2  8.5 - 10.6 MG/DL Final    Total Protein 7.6  6.0 - 8.0 G/DL Final    Total Bilirubin 0.6  0.3 - 1.2 MG/DL Final    Albumin 4.1  3.5 - 5.0 G/DL Final    Alk Phosphatase 36  25 - 110 U/L Final    AST (SGOT) 11  7 - 40 U/L Final    CO2 23  21 - 30 MMOL/L Final    ALT (SGPT) 18  7 - 56 U/L Final    Anion Gap 9  3 - 12 Final    eGFR >60  >60 mL/min Final   NT-PRO-BNP - Abnormal    NT-Pro-BNP 201.0 (*) <125 pg/mL Final   CULTURE-BLOOD W/SENSITIVITY   CULTURE-BLOOD W/SENSITIVITY   CULTURE-ANAEROBIC   CULTURE-WOUND/TISSUE/FLUID(AEROBIC ONLY)W/SENSITIVITY   HIGH SENSITIVITY TROPONIN I 0 HOUR  hs Troponin I 0 Hour 3  <12 ng/L Final   HIGH SENSITIVITY TROPONIN I 2 HOUR    hs Troponin I 2 Hour 4  <12 ng/L Final    hs Troponin I 0-2hr Delta Value 1   Final   D-DIMER    D-Dimer 440  <500 ng/mL FEU Final   PTT (APTT)    APTT 33.0  24.0 - 36.5 SEC Final   PROTIME INR (PT)    Protime 13.0  9.5 - 14.2 SEC Final    INR 1.2  0.8 - 1.2 Final   LAVENDER TOP TUBE   BLUE TOP TUBE   MINT TOP TUBE   URINALYSIS DIPSTICK REFLEX TO CULTURE   URINALYSIS MICROSCOPIC REFLEX TO CULTURE   UA GREY TOP TUBE   CLEAR TOP EXTRA URINE TUBE   HIGH SENSITIVITY TROPONIN I 4 HR   POC URINE PREGNANCY   POC LACTATE   POC LACTATE     POC Glucose (Download): (!) 128    Radiology Interpretation:  CHEST SINGLE VIEW   Final Result      No acute abnormality.Budd Palmer by Francis Dowse, M.D. on 01/31/2023 2:13 PM. Dictated by Francis Dowse, M.D. on 01/31/2023 2:13 PM.         FOOT COMP MIN 3 VIEWS RIGHT   Final Result         1.  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 Mild anterior tibiotalar spurring.      2.  Mild diffuse soft tissue edema.      3.  No discrete focal area of osteolysis to suggest osteomyelitis.          Finalized by Ranee Gosselin, M.D. on 01/31/2023 2:10 PM. Dictated by Ranee Gosselin, M.D. on 01/31/2023 2:07 PM.         US DOPPLER VENOUS RIGHT   Final Result         No evidence of deep vein thrombosis in the right leg          Finalized by Mallie Darting, M.D. on 01/31/2023 1:25 PM. Dictated by Mallie Darting, M.D. on 01/31/2023 1:22 PM.             EKG:  ECG Results              [facility] MAIN ECG TRIAGE ONLY (Final result)        Collection Time Result Time VT RATE P-R Interval QRS DURATION Q-T Interval QTC Calc Bazett    01/31/23 10:29:08 01/31/23 12:33:05 97 152 94 344 436             Collection Time Result Time P Axis R Axis T Axis    01/31/23 10:29:08 01/31/23 12:33:05 41 64 -4                     Final result                   Impression:    Normal sinus rhythm  T wave abnormality, consider inferior ischemia  Abnormal ECG  When compared with ECG of 29-Dec-2022 18:15,  No significant change was found  Confirmed by [place], [place] ([id number]) on 01/31/2023 12:33:03 PM Medical Decision Making:  Samantha Valencia is a 27 y.o. female who presents with chief complaint as listed above. Based on the history and presentation, the list of differential diagnoses considered included, but was not limited to, AMI, PE,  DVT, sepsis, cellulitis, osteomyelitis, diabetic foot ulcer, costochondritis    ED Course    ED Course as of 01/31/23 1538   Tue Jan 31, 2023   1243 Patient patient minimally tachycardic, EKG is without any sign of STEMI, we will plan to obtain labs, ultrasound to rule out DVT, x-ray of the foot, will obtain cardiac labs as well as D-dimer, and inflammatory markers to help further evaluate any concerns of osteomyelitis. [BR]   1322 White Blood Cells(!): 14.3 [BR]   1323 Pulse: 101  Will order blood cultures, lactate as suspicion for sepsis has increased likely 2/2 diabetic foot ulcer and cellulitis. D-dimer wnl.  [BR]   1452 Serial cardiac enzymes are stable without any significant delta change.  Patient's inflammatory markers are elevated.  After discussion with pharmacy, we have started bank and [employer] for antibiotic coverage.  There is no sign of acute DVT.  Her x-ray of the foot is not suggesting obvious osteomyelitis at this time.  Given her concerns of increased suspicion for possible osteo-, but with sepsis secondary to diabetic foot wounds and concomitant right leg cellulitis, we will plan admission for continued antibiotic coverage.  Swabs also obtained for aerobic and anaerobic cultures of the foot.  AOD consulted and agrees to admit the patient. 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    Additional data reviewed:    History was obtained from an independent historian: Not in addition to what is mentioned above  Prior non-ED notes reviewed: Not in addition to what is mentioned above  Independent interpretation of diagnostic tests was performed by me: Not in addition to what is mentioned above  Patient presentation/management was discussed with the following qualified health care professionals and/or other relevant professionals: Admitting Physician    Risk evaluation:    Diagnosis or treatment of patient condition impacted by social determinant of health: None  Tests Considered but not performed due to clinical scoring (if not mentioned in ED course, aside from what is implied by clinical scores listed):   Rationale regarding whether admission or escalation of care considered if not performed (if not mentioned in ED course, aside from what is implied by clinical scores listed):     ED Scoring:                                Facility Administered Meds:  Medications   vancomycin (VANCOCIN) 2,500 mg in sodium chloride 0.9% (NS) 550 mL IVPB (has no administration in time range)   acetaminophen (TYLENOL EXTRA STRENGTH) tablet 1,000 mg (1,000 mg Oral Given 01/31/23 1509)   ampicillin/sulbactam (UNASYN) 3 g in sodium chloride 0.9% (NS) 100 mL IVPB (MB+) (3 g Intravenous Given - New Bag 01/31/23 1507)       Clinical Impression:  Clinical Impression  Sepsis, due to unspecified organism, unspecified whether acute organ dysfunction present (HCC)   Cellulitis, unspecified cellulitis site   Chest wall pain   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    Medications:  New Prescriptions    No medications on file       Procedure Notes:  Procedures       Attestation / Supervision:  I, Deanne Coffer, am scribing for and in the presence of [employer].      Meghan Lendon Collar / Supervision Note concerning Samantha Valencia: I, [employer], PA-C, personally performed the services described in this documentation as scribed in my presence and it is both accurate and complete.    [employer], [employer]

## 2023-02-01 ENCOUNTER — Encounter: Admit: 2023-02-01 | Discharge: 2023-02-01 | Payer: BC Managed Care – PPO | Primary: Family

## 2023-02-01 ENCOUNTER — Inpatient Hospital Stay: Admit: 2023-02-01 | Discharge: 2023-02-01 | Payer: BC Managed Care – PPO | Primary: Family

## 2023-02-01 NOTE — Research Notes
Study Title: Thermal Imaging  HSC/IRB Number: [id number]  Consent Version Date: 09/26/2022  Consent Type: Initial    Clinical trial participation and research nature of the trial were discussed with subject during this visit. Subject was alert and oriented during consent discussion. Subject was informed that clinical trial is voluntary and she may withdraw consent at any time for any reason by notifying study team. Study purpose, procedures, tests, samples to be obtained, potential side effects, benefits, foreseeable risks and duration of study were discussed. HIPAA information, and compensation were discussed per consent form. There is no need for insurance pre-certification alternative courses of treatment were not discussed as this study involves no treatment. Subject verbalized understanding.     Subject was given time to review the consent form and to discuss participation in this study. Questions asked were answered to her satisfaction and subject voiced desire to sign consent today. Subject signed consent without coercion and undue influence. A copy of the signed consent was given to the subject and emailed [facility] Information Management (HIM) for scanning into the subject's medical record in Epic O[employer]. Contact information for the study team was given to the subject.     No research specific procedures took place prior to consenting.

## 2023-02-01 NOTE — Progress Notes
Pharmacy Vancomycin Note  Subjective:   Samantha Valencia is a 27 y.o. female being treated for SSTI.    Assessment:   Target levels for this patient:  1.  AUC (mcg*h/mL):  400-600          Plan:   Patient received vancomycin 2500 mg IV once in the ED. Will continue dosing with vancomycin 2000 m,g IV every 12 hours  Next scheduled level(s): with 4th dose  Pharmacy will continue to monitor and adjust therapy as needed.      Objective:     Drug Levels:  No results found for: "VANCOMYCIN 2HR POST DOSE", "VANCOMYCIN TROUGH", "VANCOMYCIN RANDOM"    Current Vancomycin Orders   Medication Dose Route Frequency    [START ON 02/01/2023] vancomycin (VANCOCIN) 2,000 mg in sodium chloride 0.9% (NS) 290 mL IVPB  2,000 mg Intravenous Q12H*    vancomycin, pharmacy to manage  1 each Service Per Pharmacy       Recent Vancomycin Dosing and Administration:  Recent Vancomycin Admin                     vancomycin (VANCOCIN) 2,500 mg in sodium chloride 0.9% (NS) 550 mL IVPB (mg) 2,500 mg New Bag 01/31/23 1555                    Start Date of  vancomycin therapy: 01/31/2023  Additional Abx: Piperacillin/tazobactam    Cultures:  ,  ,  ,    White Blood Cells   Date/Time Value Ref Range Status   01/31/2023 1128 14.3 (H) 4.5 - 11.0 K/UL Final     Creatinine   Date/Time Value Ref Range Status   01/31/2023 1128 0.97 0.4 - 1.00 MG/DL Final     Blood Urea Nitrogen   Date/Time Value Ref Range Status   01/31/2023 1128 12 7 - 25 MG/DL Final     Estimated CrCl:      Intake/Output Summary (Last 24 hours) at 01/31/2023 2121  Last data filed at 01/31/2023 1712  Gross per 24 hour   Intake 450 ml   Output 0 ml   Net 450 ml      UOP:    Actual Weight:  137.4 kg (302 lb 14.6 oz)  Dosing BW:  137.4 kg     Samantha Valencia PharmD   01/31/2023

## 2023-02-02 ENCOUNTER — Encounter: Admit: 2023-02-02 | Discharge: 2023-02-02 | Payer: BC Managed Care – PPO | Primary: Family

## 2023-02-02 MED ADMIN — GUAIFENESIN 600 MG PO TA12 [321343]: 600 mg | ORAL | @ 15:00:00 | Stop: 2023-02-02 | NDC 68084057211

## 2023-02-02 MED ADMIN — ENOXAPARIN 40 MG/0.4 ML SC SYRG [85052]: 40 mg | SUBCUTANEOUS | @ 02:00:00 | NDC 71288043382

## 2023-02-02 MED ADMIN — FERROUS SULFATE 325 MG (65 MG IRON) PO TAB [3074]: 325 mg | ORAL | @ 14:00:00 | Stop: 2023-02-02 | NDC 00904759161

## 2023-02-02 MED ADMIN — ENOXAPARIN 40 MG/0.4 ML SC SYRG [85052]: 40 mg | SUBCUTANEOUS | @ 14:00:00 | Stop: 2023-02-02 | NDC 71288043382

## 2023-02-07 ENCOUNTER — Encounter: Admit: 2023-02-07 | Discharge: 2023-02-07 | Payer: BC Managed Care – PPO | Primary: Family

## 2023-02-15 ENCOUNTER — Encounter: Admit: 2023-02-15 | Discharge: 2023-02-15 | Payer: BC Managed Care – PPO | Primary: Family

## 2023-02-23 ENCOUNTER — Encounter: Admit: 2023-02-23 | Discharge: 2023-02-23 | Payer: BC Managed Care – PPO | Primary: Family

## 2023-02-23 ENCOUNTER — Emergency Department: Admit: 2023-02-23 | Discharge: 2023-02-23 | Payer: BC Managed Care – PPO

## 2023-02-23 DIAGNOSIS — I890 Lymphedema, not elsewhere classified: Secondary | ICD-10-CM

## 2023-02-23 DIAGNOSIS — F32A Depression: Secondary | ICD-10-CM

## 2023-02-23 DIAGNOSIS — F419 Anxiety disorder, unspecified: Secondary | ICD-10-CM

## 2023-02-23 DIAGNOSIS — I10 Essential (primary) hypertension: Secondary | ICD-10-CM

## 2023-02-23 DIAGNOSIS — Z8742 Personal history of other diseases of the female genital tract: Secondary | ICD-10-CM

## 2023-02-23 DIAGNOSIS — E119 Type 2 diabetes mellitus without complications: Secondary | ICD-10-CM

## 2023-02-23 LAB — CBC AND DIFF
ABSOLUTE BASO COUNT: 0 K/UL (ref 0–0.20)
ABSOLUTE EOS COUNT: 0.5 K/UL — ABNORMAL HIGH (ref 0–0.45)
ABSOLUTE LYMPH COUNT: 2.6 K/UL (ref 1.0–4.8)
ABSOLUTE MONO COUNT: 0.7 K/UL (ref 0–0.80)
ABSOLUTE NEUTROPHIL: 5.6 K/UL (ref 1.8–7.0)
BASOPHILS %: 1 % (ref 0–2)
EOSINOPHILS %: 5 % (ref 0–5)
HEMATOCRIT: 33 % — ABNORMAL LOW (ref 36–45)
LYMPHOCYTES %: 28 % (ref 24–44)
MCH: 28 pg (ref 26–34)
MCHC: 33 g/dL (ref 32.0–36.0)
MCV: 86 FL (ref 80–100)
MONOCYTES %: 8 % (ref 4–12)
MPV: 7.9 FL (ref 7–11)
NEUTROPHILS %: 58 % (ref 41–77)
PLATELET COUNT: 294 K/UL (ref 150–400)
RBC COUNT: 3.8 M/UL — ABNORMAL LOW (ref 4.0–5.0)
RDW: 15 % — ABNORMAL HIGH (ref 11–15)
WBC COUNT: 9.6 K/UL (ref 4.5–11.0)

## 2023-02-23 LAB — SED RATE: ESR: 63 mm/h — ABNORMAL HIGH (ref 0–20)

## 2023-02-23 LAB — POC LACTATE: LACTIC ACID POC: 2 MMOL/L (ref 0.5–2.0)

## 2023-02-23 MED ORDER — KETOROLAC 15 MG/ML IJ SOLN
15 mg | Freq: Once | INTRAVENOUS | 0 refills | Status: CP
Start: 2023-02-23 — End: ?
  Administered 2023-02-24: 05:00:00 15 mg via INTRAVENOUS

## 2023-02-24 ENCOUNTER — Encounter: Admit: 2023-02-24 | Discharge: 2023-02-24 | Payer: BC Managed Care – PPO | Primary: Family

## 2023-02-24 ENCOUNTER — Emergency Department: Admit: 2023-02-24 | Discharge: 2023-02-23 | Payer: BC Managed Care – PPO

## 2023-02-24 ENCOUNTER — Observation Stay: Admit: 2023-02-24 | Discharge: 2023-02-24 | Payer: BC Managed Care – PPO | Primary: Family

## 2023-02-24 DIAGNOSIS — F419 Anxiety disorder, unspecified: Secondary | ICD-10-CM

## 2023-02-24 DIAGNOSIS — I10 Essential (primary) hypertension: Secondary | ICD-10-CM

## 2023-02-24 DIAGNOSIS — E119 Type 2 diabetes mellitus without complications: Secondary | ICD-10-CM

## 2023-02-24 DIAGNOSIS — Z8742 Personal history of other diseases of the female genital tract: Secondary | ICD-10-CM

## 2023-02-24 DIAGNOSIS — F32A Depression: Secondary | ICD-10-CM

## 2023-02-24 DIAGNOSIS — I890 Lymphedema, not elsewhere classified: Secondary | ICD-10-CM

## 2023-02-24 MED ORDER — LORAZEPAM 1 MG PO TAB
1 mg | Freq: Once | ORAL | 0 refills | Status: AC
Start: 2023-02-24 — End: ?

## 2023-02-24 MED ADMIN — ENOXAPARIN 40 MG/0.4 ML SC SYRG [85052]: 40 mg | SUBCUTANEOUS | @ 13:00:00 | NDC 71288043382

## 2023-02-24 MED ADMIN — OXYCODONE 5 MG PO TAB [10814]: 5 mg | ORAL | @ 19:00:00 | NDC 00406055223

## 2023-02-24 MED ADMIN — HYDROCHLOROTHIAZIDE 25 MG PO TAB [3720]: 25 mg | ORAL | @ 13:00:00 | NDC 23155000801

## 2023-02-24 MED ADMIN — TRAMADOL 50 MG PO TAB [14632]: 50 mg | ORAL | @ 09:00:00 | Stop: 2023-02-24 | NDC 00904717961

## 2023-02-24 MED ADMIN — POTASSIUM CHLORIDE 20 MEQ PO TBTQ [35943]: 40 meq | ORAL | @ 06:00:00 | Stop: 2023-02-24 | NDC 00832532510

## 2023-02-24 MED ADMIN — TOPIRAMATE 25 MG PO TAB [78823]: 50 mg | ORAL | @ 13:00:00 | NDC 68382013814

## 2023-02-24 MED ADMIN — ACETAMINOPHEN 325 MG PO TAB [101]: 650 mg | ORAL | @ 17:00:00 | NDC 00904677361

## 2023-02-24 MED ADMIN — SENNOSIDES-DOCUSATE SODIUM 8.6-50 MG PO TAB [40926]: 1 | ORAL | @ 13:00:00 | NDC 00536124810

## 2023-02-24 MED ADMIN — CEFAZOLIN 2 GRAM IV SOLR [462609]: 2 g | INTRAVENOUS | @ 17:00:00 | NDC 44567084001

## 2023-02-24 MED ADMIN — CEFTRIAXONE INJ 1GM IVP [210253]: 1 g | INTRAVENOUS | @ 07:00:00 | Stop: 2023-02-24 | NDC 60505614800

## 2023-02-24 MED ADMIN — METOPROLOL TARTRATE 25 MG PO TAB [37637]: 12.5 mg | ORAL | @ 13:00:00 | NDC 51079025501

## 2023-02-24 MED ADMIN — FUROSEMIDE 40 MG PO TAB [3295]: 20 mg | ORAL | @ 13:00:00 | NDC 00904717861

## 2023-02-24 MED ADMIN — GADOBENATE DIMEGLUMINE 529 MG/ML (0.1MMOL/0.2ML) IV SOLN [135881]: 20 mL | INTRAVENOUS | @ 10:00:00 | Stop: 2023-02-24 | NDC 00270516415

## 2023-02-24 MED ADMIN — CEFAZOLIN 2 GRAM IV SOLR [462609]: 2 g | INTRAVENOUS | @ 09:00:00 | NDC 44567084001

## 2023-02-24 NOTE — ED Notes
26 yo female presented to the ED with a cc of leg pain. Pts Rt leg is red, hot, and tender to the touch. Pt was discharged here 1 month ago for cellulitis. Pt has a hx of HTN and lymphedema, and prior cellulitis dx. A&O x4, respirations are even and unlabored, pulses palp bilaterally. Cart locked in lowest position, bed rails raised x2, call light within reach.    Medical History:   Diagnosis Date    Anxiety     Depression     Diabetes (HCC)     History of PCOS     Hypertension     Lymphedema

## 2023-02-25 ENCOUNTER — Encounter: Admit: 2023-02-25 | Discharge: 2023-02-25 | Payer: BC Managed Care – PPO | Primary: Family

## 2023-02-25 MED ADMIN — WATER FOR INJECTION, STERILE IJ SOLN [79513]: 20 mL | INTRAVENOUS | @ 18:00:00 | Stop: 2023-02-25 | NDC 00409488723

## 2023-02-25 MED ADMIN — TOPIRAMATE 25 MG PO TAB [78823]: 50 mg | ORAL | @ 14:00:00 | Stop: 2023-02-25 | NDC 68084034211

## 2023-02-25 MED ADMIN — METOPROLOL TARTRATE 25 MG PO TAB [37637]: 12.5 mg | ORAL | @ 14:00:00 | Stop: 2023-02-25 | NDC 62584026511

## 2023-02-25 MED ADMIN — HYDROCHLOROTHIAZIDE 25 MG PO TAB [3720]: 25 mg | ORAL | @ 14:00:00 | Stop: 2023-02-25 | NDC 23155000801

## 2023-02-25 MED ADMIN — FUROSEMIDE 20 MG PO TAB [3294]: 20 mg | ORAL | @ 14:00:00 | Stop: 2023-02-25 | NDC 00904717761

## 2023-02-25 MED ADMIN — PANTOPRAZOLE 40 MG PO TBEC [80436]: 40 mg | ORAL | @ 01:00:00 | NDC 00904647461

## 2023-02-25 MED ADMIN — CEFAZOLIN 2 GRAM IV SOLR [462609]: 2 g | INTRAVENOUS | @ 01:00:00 | NDC 44567084001

## 2023-02-25 MED ADMIN — TOPIRAMATE 100 MG PO TAB [82418]: 100 mg | ORAL | @ 01:00:00 | NDC 68382014014

## 2023-02-25 MED ADMIN — SENNOSIDES-DOCUSATE SODIUM 8.6-50 MG PO TAB [40926]: 1 | ORAL | @ 01:00:00 | NDC 00536124810

## 2023-02-25 MED ADMIN — LOSARTAN 50 MG PO TAB [76938]: 100 mg | ORAL | @ 01:00:00 | NDC 68084034711

## 2023-02-25 MED ADMIN — METOPROLOL TARTRATE 25 MG PO TAB [37637]: 12.5 mg | ORAL | @ 01:00:00 | NDC 62584026511

## 2023-02-25 MED ADMIN — INSULIN GLARGINE 100 UNIT/ML (3 ML) SC INJ PEN [163596]: 12 [IU] | SUBCUTANEOUS | @ 04:00:00 | NDC 00088221905

## 2023-02-25 MED ADMIN — OXYCODONE 5 MG PO TAB [10814]: 5 mg | ORAL | @ 02:00:00 | NDC 00904696661

## 2023-02-25 MED ADMIN — GABAPENTIN 100 MG PO CAP [18309]: 100 mg | ORAL | @ 02:00:00 | NDC 00904666561

## 2023-02-25 MED ADMIN — OXYCODONE 5 MG PO TAB [10814]: 5 mg | ORAL | @ 09:00:00 | Stop: 2023-02-25 | NDC 00904696661

## 2023-02-25 MED ADMIN — OXYCODONE 5 MG PO TAB [10814]: 5 mg | ORAL | @ 16:00:00 | Stop: 2023-02-25 | NDC 00904696661

## 2023-02-25 MED ADMIN — CEFAZOLIN 2 GRAM IV SOLR [462609]: 2 g | INTRAVENOUS | @ 18:00:00 | Stop: 2023-02-25 | NDC 44567084001

## 2023-02-25 MED ADMIN — ENOXAPARIN 40 MG/0.4 ML SC SYRG [85052]: 40 mg | SUBCUTANEOUS | @ 14:00:00 | Stop: 2023-02-25 | NDC 71288043382

## 2023-02-25 MED ADMIN — FLUOXETINE 20 MG PO CAP [10070]: 40 mg | ORAL | @ 01:00:00 | NDC 65862019301

## 2023-02-25 MED ADMIN — CEFAZOLIN 2 GRAM IV SOLR [462609]: 2 g | INTRAVENOUS | @ 09:00:00 | Stop: 2023-02-25 | NDC 44567084001

## 2023-02-25 MED ADMIN — ENOXAPARIN 40 MG/0.4 ML SC SYRG [85052]: 40 mg | SUBCUTANEOUS | @ 01:00:00 | NDC 71288043382

## 2023-02-25 MED FILL — OXYCODONE 5 MG PO TAB: 5 mg | ORAL | 4 days supply | Qty: 10 | Fill #1 | Status: CP

## 2023-02-25 MED FILL — LINEZOLID 600 MG PO TAB: 600 mg | ORAL | 7 days supply | Qty: 13 | Fill #1 | Status: CP

## 2023-02-27 ENCOUNTER — Encounter: Admit: 2023-02-27 | Discharge: 2023-02-27 | Payer: BC Managed Care – PPO | Primary: Family

## 2023-02-28 ENCOUNTER — Encounter: Admit: 2023-02-28 | Discharge: 2023-02-28 | Payer: BC Managed Care – PPO | Primary: Family

## 2023-03-01 ENCOUNTER — Encounter: Admit: 2023-03-01 | Discharge: 2023-03-01 | Payer: BC Managed Care – PPO | Primary: Family

## 2023-03-01 NOTE — Telephone Encounter
03/01/23 - Records requested from Darden Dates [phone] per [facility] note below lkg    --Darden Dates    [address], [place], [place] [address]  [phone]--

## 2023-03-07 ENCOUNTER — Encounter: Admit: 2023-03-07 | Discharge: 2023-03-07 | Payer: BC Managed Care – PPO | Primary: Family

## 2023-03-07 ENCOUNTER — Ambulatory Visit: Admit: 2023-03-07 | Discharge: 2023-03-08 | Payer: BC Managed Care – PPO | Primary: Family

## 2023-03-07 DIAGNOSIS — F32A Depression: Secondary | ICD-10-CM

## 2023-03-07 DIAGNOSIS — Z8742 Personal history of other diseases of the female genital tract: Secondary | ICD-10-CM

## 2023-03-07 DIAGNOSIS — F419 Anxiety disorder, unspecified: Secondary | ICD-10-CM

## 2023-03-07 DIAGNOSIS — E11621 Type 2 diabetes mellitus with foot ulcer: Secondary | ICD-10-CM

## 2023-03-07 DIAGNOSIS — E119 Type 2 diabetes mellitus without complications: Secondary | ICD-10-CM

## 2023-03-07 DIAGNOSIS — I890 Lymphedema, not elsewhere classified: Secondary | ICD-10-CM

## 2023-03-07 DIAGNOSIS — I10 Essential (primary) hypertension: Secondary | ICD-10-CM

## 2023-03-07 NOTE — Progress Notes
Name:Samantha Valencia           MRN: [id number]                 DOB:[birth year]          Age: 27 y.o.  Date of Service: 03/07/2023    Subjective:            Medical History:   Diagnosis Date    Anxiety     Depression     Diabetes (HCC)     History of PCOS     Hypertension     Lymphedema        History reviewed. No pertinent surgical history.    family history includes Bipolar Disorder in her mother; Schizophrenia in her father.    Social History     Socioeconomic History    Marital status: Single   Tobacco Use    Smoking status: Never    Smokeless tobacco: Never   Substance and Sexual Activity    Alcohol use: Never    Drug use: Never                       History of Present Illness    Samantha Valencia is a 27 y.o. female. Pt with a history of diabetes presents with wound on bottom of right foot. Pt had been in the hospital for recurrent right lower extremity cellulitis. On initial presentation, wound had thick callus in place but pt stated that she could feel pain under callus. Callus debrided in clinic revealing a fissure present. No signs of infection noted at that time. Pt was wearing an offloading boot for a majority of the day. Pt with a CGM and last A1C on 01/31/23 was 6.0%. Pt does not smoke. Pt eats a fair amount of protein in her diet. Regularly wears compression. Had MRI on 02/24/23 which showed no signs of osteomyelitis.    Review of Systems   Skin:  Positive for wound.   All other systems reviewed and are negative.       Objective:          cetirizine (ZYRTEC) 10 mg tablet Take one tablet by mouth at bedtime as needed.    cholecalciferol (vitamin D3) (VITAMIN D3 PO) Take 2 Gummy by mouth daily. 2 gummies = 2000 IU    diphenhydrAMINE HCL (BENADRYL) 25 mg capsule Take one capsule by mouth every 8 hours.    ferrous sulfate (FEOSOL) 325 mg (65 mg iron) tablet Take one tablet by mouth three times weekly. Take on an empty stomach at least 1 hour before or 2 hours after food.  Indications: anemia from inadequate iron    fluoxetine (PROZAC) 40 mg capsule Take one capsule by mouth at bedtime daily.    furosemide (LASIX) 20 mg tablet Take one tablet by mouth every morning.    gabapentin (NEURONTIN) 100 mg capsule Take one capsule by mouth at bedtime daily.    guaiFENesin LA (MUCINEX) 600 mg tablet Take one tablet by mouth twice daily. Indications: cold symptoms, cough    hydroCHLOROthiazide (HYDRODIURIL) 25 mg tablet Take one tablet by mouth every morning.    hydrOXYzine HCL (ATARAX) 25 mg tablet Take one tablet by mouth at bedtime daily. Indications: anxious    ibuprofen (MOTRIN) 800 mg tablet Take one tablet by mouth every 8 hours as needed for Pain. Take with food.    insulin glargine(+) (LANTUS U-100 INSULIN) 100 unit/mL subcutaneous solution Inject fifteen Units  under the skin at bedtime daily. Indications: type 2 diabetes mellitus    losartan (COZAAR) 100 mg tablet Take one tablet by mouth at bedtime daily.    magnesium oxide (MAGOX) 400 mg (241.3 mg magnesium) tablet Take one tablet by mouth twice daily.    medroxyPROGESTERone (DEPO-PROVERA) 150 mg/mL injection Inject 1 mL into the muscle every 90 days.    melatonin 5 mg chew Chew 3 Gummy by mouth at bedtime as needed.    metFORMIN (GLUCOPHAGE) 1,000 mg tablet Take one tablet by mouth twice daily with meals.    metoprolol tartrate 25 mg tablet Take one-half tablet by mouth twice daily.    montelukast (SINGULAIR) 10 mg tablet Take one tablet by mouth at bedtime daily.    omeprazole DR (PRILOSEC) 20 mg capsule Take one capsule by mouth daily before breakfast.    other medication Medication Name & Strength: [place] Probiotic Multi-Enzyme Digestive Formula Tablets    Dose(how many): 3 tablets    Frequency(how often): daily    other medication Medication Name & Strength: AZO Yeast Plus    Dose(how many): 1 tablet    Frequency(how often): three times daily as needed    oxyCODONE (ROXICODONE) 5 mg tablet Take one tablet by mouth every 8 hours as needed for Pain. Indications: pain    oxymetazoline (AFRIN EXTRA MOISTURIZING) 0.05 % nasal spray Apply two sprays to each nostril as directed daily as needed (allergies). Max of 3 days.    OZEMPIC 1 mg/dose (4 mg/3 mL) injection PEN Inject one mg under the skin every 7 days.    PROAIR HFA 90 mcg/actuation inhaler Inhale two puffs by mouth into the lungs every 4 hours as needed.    prochlorperazine maleate (COMPAZINE) 10 mg tablet Take one tablet by mouth every 8 hours as needed for Other... (take with benadryl as needed for migraine headache).    riboflavin (vitamin B2) 400 mg tablet Take one tablet by mouth daily.    SUMAtriptan succinate (IMITREX) 100 mg tablet Take one tablet by mouth at onset of headache. May repeat after 2 hours if needed. Max of 200 mg in 24 hours.    tiZANidine (ZANAFLEX) 4 mg tablet Take one-half tablet by mouth daily as needed.    topiramate (TOPAMAX) 50 mg tablet Take 50mg  in the morning and 100mg  at night for total of 150mg  daily.     Vitals:    03/07/23 0932   BP: 137/54   Pulse: 81   Temp: 36.3 ?C (97.4 ?F)   SpO2: 100%   PainSc: Four     There is no height or weight on file to calculate BMI.   Physical Exam  Constitutional:       Appearance: Normal appearance.   Eyes:      Conjunctiva/sclera: Conjunctivae normal.      Pupils: Pupils are equal, round, and reactive to light.   Cardiovascular:      Rate and Rhythm: Normal rate.   Pulmonary:      Effort: Pulmonary effort is normal.   Musculoskeletal:         General: Normal range of motion.      Cervical back: Normal range of motion.   Skin:     General: Skin is warm and dry.      Capillary Refill: Capillary refill takes less than 2 seconds.   Neurological:      Mental Status: She is alert. Mental status is at baseline.   Psychiatric:  Mood and Affect: Mood normal.         Thought Content: Thought content normal.           Wounds Pressure injury Right;Plantar (Active)   03/07/23 0935   Wound Type: Pressure injury   Orientation: Right;Plantar Location:    Wound Location Comments:    Initial Wound Site Closure:    Initial Dressing Placed:    Initial Cycle:    Initial Suction Setting (mmHg):    Pressure Injury Stages:    Pressure Injury Present Within 24 Hours of Hospital Admission:    If This Pressure Injury Is Suspected to Be Device Related, Please Select the Device::    Is the Wound Open or Closed:    Image   03/07/23 1000   Wound Assessment Dry 03/07/23 0900   Peri-wound Assessment Intact;Callous 03/07/23 0900   Wound Drainage Amount None 03/07/23 0900   Wound Dressing Status None/open to air 03/07/23 0900   Wound Care Dressing changed or new application 03/07/23 1000   Wound Dressing and/or Treatment Hydrofera blue ready;Hypafix tape 03/07/23 1000   Wound Status (Wound Team Only) Being Treated 03/07/23 1000   Wound Length (cm) (Wound Team Only) 0.6 cm 03/07/23 1000   Wound Width (cm) (Wound Team Only) 0.2 cm 03/07/23 1000   Wound Depth (cm) (Wound Team Only) 0.2 cm 03/07/23 1000   Wound Surface Area (cm^2) (Wound Team Only) 0.12 cm^2 03/07/23 1000   Wound Volume (cm^3) (Wound Team Only) 0.024 cm^3 03/07/23 1000   Number of days: 0                       ALL wounds debrided using: sharp     Assessment and Plan:  Diabetic foot ulcer, right plantar foot, fat layer exposed  - Hydrofera blue dressing, secure with hypafix, change daily  - Keep clean and dry  - Monitor and report any signs of infection  - Continue offloading in boot  - Compression daily  - Maintain adequate glucose levels  - Protein in diet  - Follow-up in 2 weeks    Total Time Today was 60 minutes in the following activities: Preparing to see the patient, Obtaining and/or reviewing separately obtained history, Performing a medically appropriate examination and/or evaluation, Counseling and educating the patient/family/caregiver, Documenting clinical information in the electronic or other health record, and Independently interpreting results (not separately reported) and communicating results to the patient/family/caregiver

## 2023-03-08 DIAGNOSIS — E1165 Type 2 diabetes mellitus with hyperglycemia: Secondary | ICD-10-CM

## 2023-03-08 DIAGNOSIS — L97519 Non-pressure chronic ulcer of other part of right foot with unspecified severity: Secondary | ICD-10-CM

## 2023-03-08 DIAGNOSIS — E13621 Other specified diabetes mellitus with foot ulcer: Secondary | ICD-10-CM

## 2023-03-08 DIAGNOSIS — Z794 Long term (current) use of insulin: Secondary | ICD-10-CM

## 2023-03-08 DIAGNOSIS — L03115 Cellulitis of right lower limb: Secondary | ICD-10-CM

## 2023-03-14 ENCOUNTER — Encounter: Admit: 2023-03-14 | Discharge: 2023-03-14 | Payer: BC Managed Care – PPO | Primary: Family

## 2023-03-14 ENCOUNTER — Inpatient Hospital Stay: Admit: 2023-03-14 | Discharge: 2023-03-14 | Payer: BC Managed Care – PPO | Primary: Family

## 2023-03-14 ENCOUNTER — Emergency Department: Admit: 2023-03-14 | Discharge: 2023-03-14 | Payer: BC Managed Care – PPO

## 2023-03-14 ENCOUNTER — Inpatient Hospital Stay: Admit: 2023-03-14 | Payer: BC Managed Care – PPO

## 2023-03-14 DIAGNOSIS — F419 Anxiety disorder, unspecified: Secondary | ICD-10-CM

## 2023-03-14 DIAGNOSIS — Z8742 Personal history of other diseases of the female genital tract: Secondary | ICD-10-CM

## 2023-03-14 DIAGNOSIS — E119 Type 2 diabetes mellitus without complications: Secondary | ICD-10-CM

## 2023-03-14 DIAGNOSIS — I890 Lymphedema, not elsewhere classified: Secondary | ICD-10-CM

## 2023-03-14 DIAGNOSIS — I10 Essential (primary) hypertension: Secondary | ICD-10-CM

## 2023-03-14 DIAGNOSIS — A419 Sepsis, unspecified organism: Secondary | ICD-10-CM

## 2023-03-14 DIAGNOSIS — L03115 Cellulitis of right lower limb: Secondary | ICD-10-CM

## 2023-03-14 DIAGNOSIS — L0390 Cellulitis, unspecified: Secondary | ICD-10-CM

## 2023-03-14 DIAGNOSIS — F32A Depression: Secondary | ICD-10-CM

## 2023-03-14 LAB — C REACTIVE PROTEIN (CRP): C-REACTIVE PROTEIN: 9.4 mg/dL — ABNORMAL HIGH (ref <1.0)

## 2023-03-14 LAB — CBC AND DIFF
ABSOLUTE BASO COUNT: 0 K/UL (ref 0–0.20)
ABSOLUTE EOS COUNT: 0.1 K/UL (ref 0–0.45)
ABSOLUTE MONO COUNT: 0.8 K/UL — ABNORMAL HIGH (ref 0–0.80)
MDW (MONOCYTE DISTRIBUTION WIDTH): 30 — ABNORMAL HIGH (ref <20.7)
WBC COUNT: 17 K/UL — ABNORMAL HIGH (ref 4.5–11.0)

## 2023-03-14 LAB — COMPREHENSIVE METABOLIC PANEL
ALBUMIN: 3.9 g/dL — ABNORMAL HIGH (ref 3.5–5.0)
ALK PHOSPHATASE: 34 U/L — ABNORMAL LOW (ref 25–110)
ALT: 21 U/L (ref 7–56)
ANION GAP: 11 K/UL — ABNORMAL HIGH (ref 3–12)
AST: 20 U/L (ref 7–40)
BLD UREA NITROGEN: 11 mg/dL (ref 7–25)
CO2: 21 MMOL/L (ref 21–30)
CREATININE: 1 mg/dL (ref 0.4–1.00)
EGFR: >60 mL/min (ref >60)
SODIUM: 138 MMOL/L — ABNORMAL LOW (ref 137–147)
TOTAL BILIRUBIN: 0.7 mg/dL (ref 0.3–1.2)
TOTAL PROTEIN: 7 g/dL (ref 6.0–8.0)

## 2023-03-14 LAB — POC LACTATE: LACTIC ACID POC: 1.6 MMOL/L (ref 0.5–2.0)

## 2023-03-14 LAB — HIGH SENSITIVITY TROPONIN I, RANDOM: HIGH SENSITIVITY TROPONIN I: 3 ng/L (ref <12)

## 2023-03-14 LAB — POC GLUCOSE
POC GLUCOSE: 132 mg/dL — ABNORMAL HIGH (ref 70–100)
POC GLUCOSE: 118 mg/dL — ABNORMAL HIGH (ref 70–100)
POC GLUCOSE: 113 mg/dL — ABNORMAL HIGH (ref 70–100)
POC GLUCOSE: 152 mg/dL — ABNORMAL HIGH (ref 70–100)

## 2023-03-14 LAB — SED RATE: ESR: 44 mm/h — ABNORMAL HIGH (ref 0–20)

## 2023-03-14 LAB — INFLUENZA A/B AND RSV PCR
FLU A: NEGATIVE
FLU B: NEGATIVE
RSV: NEGATIVE

## 2023-03-14 LAB — COVID-19 (SARS-COV-2) PCR

## 2023-03-14 LAB — HIGH SENSITIVITY TROPONIN I 0 HOUR: HIGH SENSITIVITY TROPONIN I 0 HOUR: 3 ng/L (ref <12)

## 2023-03-14 LAB — MAGNESIUM: MAGNESIUM: 1.9 mg/dL — ABNORMAL HIGH (ref 1.6–2.6)

## 2023-03-14 MED ORDER — LINEZOLID IN DEXTROSE 5% 600 MG/300 ML IV PGBK
600 mg | Freq: Once | INTRAVENOUS | 0 refills | Status: CP
Start: 2023-03-14 — End: ?
  Administered 2023-03-14: 15:00:00 600 mg via INTRAVENOUS

## 2023-03-14 MED ORDER — FLUOXETINE 10 MG PO CAP
40 mg | Freq: Every evening | ORAL | 0 refills | Status: AC
Start: 2023-03-14 — End: ?
  Administered 2023-03-15 – 2023-03-17 (×3): 40 mg via ORAL

## 2023-03-14 MED ORDER — MONTELUKAST 10 MG PO TAB
10 mg | Freq: Every evening | ORAL | 0 refills | Status: AC
Start: 2023-03-14 — End: ?
  Administered 2023-03-15 – 2023-03-17 (×3): 10 mg via ORAL

## 2023-03-14 MED ORDER — LINEZOLID IN DEXTROSE 5% 600 MG/300 ML IV PGBK
600 mg | Freq: Two times a day (BID) | INTRAVENOUS | 0 refills | Status: DC
Start: 2023-03-14 — End: 2023-03-14

## 2023-03-14 MED ORDER — LOSARTAN 50 MG PO TAB
100 mg | Freq: Every evening | ORAL | 0 refills | Status: AC
Start: 2023-03-14 — End: ?
  Administered 2023-03-15 – 2023-03-17 (×3): 100 mg via ORAL

## 2023-03-14 MED ORDER — METOPROLOL TARTRATE 25 MG PO TAB
12.5 mg | Freq: Two times a day (BID) | ORAL | 0 refills | Status: AC
Start: 2023-03-14 — End: ?
  Administered 2023-03-15 – 2023-03-17 (×6): 12.5 mg via ORAL

## 2023-03-14 MED ORDER — ENOXAPARIN 40 MG/0.4 ML SC SYRG
40 mg | Freq: Two times a day (BID) | SUBCUTANEOUS | 0 refills | Status: AC
Start: 2023-03-14 — End: ?
  Administered 2023-03-15 – 2023-03-17 (×6): 40 mg via SUBCUTANEOUS

## 2023-03-14 MED ORDER — ONDANSETRON HCL (PF) 4 MG/2 ML IJ SOLN
4 mg | INTRAVENOUS | 0 refills | Status: AC | PRN
Start: 2023-03-14 — End: ?
  Administered 2023-03-17: 13:00:00 4 mg via INTRAVENOUS

## 2023-03-14 MED ORDER — TOPIRAMATE 100 MG PO TAB
100 mg | Freq: Every evening | ORAL | 0 refills | Status: AC
Start: 2023-03-14 — End: ?
  Administered 2023-03-15 – 2023-03-17 (×3): 100 mg via ORAL

## 2023-03-14 MED ORDER — MELATONIN 5 MG PO TAB
5 mg | Freq: Every evening | ORAL | 0 refills | Status: AC | PRN
Start: 2023-03-14 — End: ?
  Administered 2023-03-17: 01:00:00 5 mg via ORAL

## 2023-03-14 MED ORDER — TOPIRAMATE 25 MG PO TAB
50 mg | Freq: Every morning | ORAL | 0 refills | Status: AC
Start: 2023-03-14 — End: ?
  Administered 2023-03-14 – 2023-03-17 (×4): 50 mg via ORAL

## 2023-03-14 MED ORDER — FENTANYL CITRATE (PF) 50 MCG/ML IJ SOLN
75 ug | Freq: Once | INTRAVENOUS | 0 refills | Status: CP
Start: 2023-03-14 — End: ?
  Administered 2023-03-14: 12:00:00 75 ug via INTRAVENOUS

## 2023-03-14 MED ORDER — BISACODYL 10 MG RE SUPP
10 mg | Freq: Every day | RECTAL | 0 refills | Status: AC | PRN
Start: 2023-03-14 — End: ?

## 2023-03-14 MED ORDER — INSULIN GLARGINE 100 UNIT/ML (3 ML) SC INJ PEN
15 [IU] | Freq: Every evening | SUBCUTANEOUS | 0 refills | Status: AC
Start: 2023-03-14 — End: ?
  Administered 2023-03-15: 03:00:00 15 [IU] via SUBCUTANEOUS

## 2023-03-14 MED ORDER — HYDROXYZINE HCL 25 MG PO TAB
25 mg | Freq: Every evening | ORAL | 0 refills | Status: AC
Start: 2023-03-14 — End: ?
  Administered 2023-03-15 – 2023-03-17 (×3): 25 mg via ORAL

## 2023-03-14 MED ORDER — ENOXAPARIN 40 MG/0.4 ML SC SYRG
40 mg | Freq: Every day | SUBCUTANEOUS | 0 refills | Status: DC
Start: 2023-03-14 — End: 2023-03-14

## 2023-03-14 MED ORDER — FUROSEMIDE 40 MG PO TAB
20 mg | Freq: Every morning | ORAL | 0 refills | Status: AC
Start: 2023-03-14 — End: ?
  Administered 2023-03-14 – 2023-03-17 (×4): 20 mg via ORAL

## 2023-03-14 MED ORDER — FENTANYL CITRATE (PF) 50 MCG/ML IJ SOLN
75 ug | Freq: Once | INTRAVENOUS | 0 refills | Status: CP
Start: 2023-03-14 — End: ?
  Administered 2023-03-14: 15:00:00 75 ug via INTRAVENOUS

## 2023-03-14 MED ORDER — OXYCODONE 5 MG PO TAB
5 mg | ORAL | 0 refills | Status: DC | PRN
Start: 2023-03-14 — End: 2023-03-15
  Administered 2023-03-14 – 2023-03-15 (×2): 5 mg via ORAL

## 2023-03-14 MED ORDER — HYDROCHLOROTHIAZIDE 25 MG PO TAB
25 mg | Freq: Every morning | ORAL | 0 refills | Status: AC
Start: 2023-03-14 — End: ?
  Administered 2023-03-14 – 2023-03-17 (×4): 25 mg via ORAL

## 2023-03-14 MED ORDER — ONDANSETRON 4 MG PO TBDI
4 mg | ORAL | 0 refills | Status: AC | PRN
Start: 2023-03-14 — End: ?

## 2023-03-14 MED ORDER — GADOBENATE DIMEGLUMINE 529 MG/ML (0.1MMOL/0.2ML) IV SOLN
20 mL | Freq: Once | INTRAVENOUS | 0 refills | Status: CP
Start: 2023-03-14 — End: ?
  Administered 2023-03-14: 18:00:00 20 mL via INTRAVENOUS

## 2023-03-14 MED ORDER — INSULIN ASPART 100 UNIT/ML SC FLEXPEN
0-6 [IU] | Freq: Before meals | SUBCUTANEOUS | 0 refills | Status: AC
Start: 2023-03-14 — End: ?
  Administered 2023-03-15: 19:00:00 1 [IU] via SUBCUTANEOUS

## 2023-03-14 MED ORDER — CEFAZOLIN 2 GRAM IV SOLR
2 g | INTRAVENOUS | 0 refills | Status: AC
Start: 2023-03-14 — End: ?
  Administered 2023-03-14 – 2023-03-17 (×9): 2 g via INTRAVENOUS

## 2023-03-14 MED ORDER — TRAMADOL 50 MG PO TAB
50 mg | Freq: Once | ORAL | 0 refills | Status: CP | PRN
Start: 2023-03-14 — End: ?
  Administered 2023-03-15: 04:00:00 50 mg via ORAL

## 2023-03-14 MED ORDER — LACTATED RINGERS IV SOLP
1000 mL | INTRAVENOUS | 0 refills | Status: CP
Start: 2023-03-14 — End: ?
  Administered 2023-03-14: 12:00:00 1000 mL via INTRAVENOUS

## 2023-03-14 MED ORDER — GABAPENTIN 100 MG PO CAP
100 mg | Freq: Every evening | ORAL | 0 refills | Status: AC
Start: 2023-03-14 — End: ?
  Administered 2023-03-15 – 2023-03-17 (×3): 100 mg via ORAL

## 2023-03-14 MED ORDER — MAGNESIUM OXIDE 400 MG (241.3 MG MAGNESIUM) PO TAB
400 mg | Freq: Two times a day (BID) | ORAL | 0 refills | Status: AC
Start: 2023-03-14 — End: ?
  Administered 2023-03-14 – 2023-03-17 (×7): 400 mg via ORAL

## 2023-03-14 MED ORDER — POLYETHYLENE GLYCOL 3350 17 GRAM PO PWPK
1 | Freq: Every day | ORAL | 0 refills | Status: AC | PRN
Start: 2023-03-14 — End: ?
  Administered 2023-03-16: 02:00:00 17 g via ORAL

## 2023-03-14 MED ORDER — ACETAMINOPHEN 325 MG PO TAB
650 mg | ORAL | 0 refills | Status: AC | PRN
Start: 2023-03-14 — End: ?
  Administered 2023-03-14 – 2023-03-15 (×4): 650 mg via ORAL

## 2023-03-14 MED ORDER — OXYCODONE 5 MG PO TAB
5 mg | ORAL | 0 refills | Status: AC | PRN
Start: 2023-03-14 — End: ?
  Administered 2023-03-15 (×3): 5 mg via ORAL

## 2023-03-14 MED ORDER — PANTOPRAZOLE 40 MG PO TBEC
40 mg | Freq: Every day | ORAL | 0 refills | Status: AC
Start: 2023-03-14 — End: ?
  Administered 2023-03-15 – 2023-03-17 (×3): 40 mg via ORAL

## 2023-03-14 MED ORDER — DEXTROSE 50 % IN WATER (D50W) IV SYRG
12.5-25 g | INTRAVENOUS | 0 refills | Status: AC | PRN
Start: 2023-03-14 — End: ?

## 2023-03-14 NOTE — Consults
Wound Ostomy Note    NAME:Samantha Valencia                                                                   MRN: [id number]                 DOB:1996-10-28          AGE: 27 y.o.  ADMISSION DATE: 03/14/2023             DAYS ADMITTED: LOS: 0 days      Reason for Consult/Visit:   wound not pressure    Consulted for a right diabetic foot ulcer ron this patient. She saw Dr. Egbert Garibaldi in the outpatient wound clinic on 5/7.   His recommendations are listed below.     Primary team is responsible for placing wound care orders.    Right Foot-  - Apply a piece of Hydrofera blue dressing to wound base   - Secure with hypafix, change daily  - Continue offloading in boot  - Compression daily    Other recs--  - Maintain adequate glucose levels  - Protein in diet  - Follow-up in 2 weeks     Our service will sign off at this time. Please reconsult if affected areas do not improve despite implementing recommendations above.    Guido Sander, RN, BSN, [employer]  Wound/Ostomy Nursing Consult Service  Available via [employer] text when in house.   For questions after 3:30pm M-F/weekends/holidays, [employer] First Contact

## 2023-03-14 NOTE — ED Notes
Patient complains of right leg swelling with wound on bottom of right foot. Patient recently discharged from this facility for same issues. Patient states wound began about two weeks prior to last admission. Patient states issues have gotten worse since discharge. Patient has history of diabetes with diabetic ulcers. Patient speaking in complete sentences. No signs of acute distress. Patient on monitor. Patient sitting up in bed. Call light within reach.

## 2023-03-14 NOTE — Case Management (ED)
Case Management 30 Day Readmission Assessment      NAME: Paislei Gustine                    MRN: [id number]              DOB: 07/26/1996          AGE: 27 y.o.  ADMISSION DATE: 03/14/2023             DAYS ADMITTED: LOS: 0 days    Today's Date: 03/14/2023    Source of Information: Patient and EMR    Expected Discharge Date:       Plan:  This CM met with pt for assessment on this date. Provided contact information and explanation of SW/NCM roles. Provided opportunity for questions and discussion. Pt/family encouraged to contact Case Management team with questions and concerns during hospitalization and until patient is able to transition back to the patient's primary care physician.     Pt anticipated to d/c home pending medical stability. Pt/family expressed concern that they would like to have more test done. SW provided support and provided resource. SW will continue to follow and assist with d/c planning.     Previous Inpatient Discharge Date:    02/25/23    Previous Case Management admission assessment dated 02/25/2023 is copied at the end of this note for reference and was reviewed with patient/family.  Changes to information from previous admission assessment are: none.     Acute Hospital Stay:  Acute Hospital Stay: Yes  Was patient's stay within the last 30 days?: Yes  Name of Hospital: [facility]  When did patient receive care?: 02/25/2023    Prior Living Situation/Admitted from?   Type of Residence: Home, independent    Post-Acute Services/DME Last Discharge:  Were post-acute services/DME arranged at previous disharge?: No    Lack of services:  At or after previous discharge, what did the patient/family feel they lacked?: Patient/family felt discharge services were adequate    Previous Discharge Resources Provided:  Were resources provided to patient at previous discharge in person or within AVS?: No    Social Determinants of Health (SDOH):  Within the past 12 months we worried whether our food would run out before we got money to buy more.: Never True  Within the past 12 months the food we bought just didn't last and we didn't have money to get more.: Never True  In the last 12 months, have you needed to see a doctor, but could not because of cost?: No  In the past 12 months, have you ever gone without  health care because you didn't have a way to get there?: No  In the last 12 months, did you skip medications to save money?: No  In the last 12 months, has your utility company shut off your service for not paying your bills?: No  Are you worried that in the next 2 months, you may not have stable housing?: No    Transportation:  Does the Patient Need Case Management to Arrange Discharge Transport? (ex: facility, ambulance, wheelchair/stretcher, Medicaid, cab, other): No  Will the Patient Use Family Transport?: Yes    Attilio Zeitler Luan Moore, LMSW  Social Work Case Manager  [employer]    Case Management 30 Day Readmission Assessment        NAME: Gibson Gatta                    MRN: [id number]  DOB: 1996/01/08          AGE: 27 y.o.  ADMISSION DATE: 02/23/2023             DAYS ADMITTED: LOS: 0 days     Today's Date: 02/24/2023     Source of Information: Patient and EMR     Expected Discharge Date:  02/25/2023      Plan:  Anticipate dc pending medical stability.      Previous Inpatient Discharge Date:    02/02/23     Previous Case Management admission assessment dated 02/01/23 is copied at the end of this note for reference and was reviewed with patient/family.  Changes to information from previous admission assessment are: none.     Acute Hospital Stay:  Acute Hospital Stay: Yes  Was patient's stay within the last 30 days?: Yes  Name of Hospital: [facility]  When did patient receive care?: 01/31/23-02/02/23  Readmission Code Group: 5. Medical Plan of Care - Treatment or Possible Complication  5. Medical Plan of Care - Treatment or Possible Complication: 5a. Possible infection     Prior Living Situation/Admitted from?   Type of Residence: Home, independent     Post-Acute Services/DME Last Discharge:  Were post-acute services/DME arranged at previous disharge?: No     Lack of services:  At or after previous discharge, what did the patient/family feel they lacked?: Not applicable     Previous Discharge Resources Provided:  Were resources provided to patient at previous discharge in person or within AVS?: No     Social Determinants of Health (SDOH):     Pt requested financial resources d/t concerns for financial resource strain. SDOH resources auto-added to AVS. No barriers, first time provided.     Transportation:  Does the Patient Need Case Management to Arrange Discharge Transport? (ex: facility, ambulance, wheelchair/stretcher, Medicaid, cab, other): No  Will the Patient Use Family Transport?: Yes     Silva Bandy, LMSW  Available on [facility] [employer]  [phone]           Creation Time: 02/01/23 1339        Case Management Admission Assessment     NAME:Ayano Mcdougall                          MRN: [id number]             DOB:September 05, 1996          AGE: 27 y.o.  ADMISSION DATE: 01/31/2023             DAYS ADMITTED: LOS: 1 day      Today?s Date: 02/01/2023     Source of Information: patient     This CM met with pt for assessment on this date.  Provided contact information and explanation of SW/NCM roles.  Reviewed [facility], Preparing for Discharge, and [facility] hand-outs.  Provided opportunity for questions and discussion. Pt/family encouraged to contact Case Management team with questions and concerns during hospitalization and until patient is able to transition back to the patient's primary care physician.        Plan  Plan: Case Management Assessment, Assist PRN with SW/NCM Services     NCM reviewed EMR.  NCM attended and participated in interdisciplinary huddle.  Verified demographics and PCP.  Patient lives with her sister and sisters family in a two story home with 2-3 STE. She is independent with all ADL's.  DME/HH/HI/IPR/SNF as documented below.  No current NCM needs  identified at this time. Will continue to follow until discharge.     Patient Address/Phone  [address]  [place] [address]  [phone] (home)      Emergency Contact  Extended Emergency Contact Information  Primary Emergency Contact: Bretthauer,Brandi  Mobile Phone: [phone]  Relation: Sister     Network engineer Directive: No, patient does not have a healthcare directive  Would patient like to fill out a (a new) Editor, commissioning?: No, patient declined        Transportation  Does the Patient Need Case Management to Arrange Discharge Transport? (ex: facility, ambulance, wheelchair/stretcher, Medicaid, cab, other): No  Will the Patient Use Family Transport?: Yes  Transportation Name, Phone and Availability #1: sister or will drive self home     Expected Discharge Date  02/02/2023      Living Situation Prior to Admission  Living Arrangements  Type of Residence: Home, independent  Living Arrangements: Family members (lives with sister and sisters family)  Financial risk analyst / Tub: Tub/Shower Unit  How many levels in the residence?: 2  Can patient live on one level if needed?: Yes  Does residence have entry and/or inside stairs?: Yes (2-3 STE)  Assistance needed prior to admit or anticipated on discharge: No  Level of Function   Prior level of function: Independent  Cognitive Abilities   Cognitive Abilities: Alert and Oriented, Participates in decision making, Recognizes impact of health condition on lifestyle, Engages in problem solving and planning, Understands nature of health condition     Forensic scientist  Primary Insurance: Nurse, learning disability  Medication Coverage    Medication Coverage: Commercial insurance  Have you experienced a noticeable increase in your copay costs recently?: No  Are current medications affordable?: Yes  Do You Use a Co-Pay Card or a Medication Assistance Program to Help Manage Medication Costs?: No  Do You Manage Your Own Medications?: Yes  Source of Income   Source Of Income: Employed  Financial Assistance Needed?  N/a     Psychosocial Needs  Mental Health  Mental Health History: No  Substance Use History  Substance Use History Screen: No  Other  N/a     Current/Previous Services  PCP  Nathaneil Canary, [phone], [phone]  Pharmacy     [facility] [address], [place] - [address]  [address]  [place] [address]  Phone: [phone] Fax: [phone]     Durable Medical Equipment   Durable Medical Equipment at home: None  Home Health  Receiving home health: No  Hemodialysis or Peritoneal Dialysis  Undergoing hemodialysis or peritoneal dialysis: No  Tube/Enteral Feeds  Receive tube/enteral feeds: No  Infusion  Receive infusions: No  Private Duty  Private duty help used: No  Home and Community Based Services  Home and community based services: No  Ryan White  Ryan White: N/A  Hospice  Hospice: No  Outpatient Therapy  PT: No  OT: No  SLP: No  Skilled Nursing Facility/Nursing Home  SNF: No  NH: No  Inpatient Rehab  IPR: No  Long-Term Acute Care Hospital  LTACH: No  Acute Hospital Stay  Acute Hospital Stay: In the past  Was patient's stay within the last 30 days?: No        Venia Minks, BSN, Museum/gallery conservator  Available on [employer]

## 2023-03-15 ENCOUNTER — Encounter: Admit: 2023-03-15 | Discharge: 2023-03-15 | Payer: BC Managed Care – PPO | Primary: Family

## 2023-03-15 ENCOUNTER — Inpatient Hospital Stay: Admit: 2023-03-15 | Discharge: 2023-03-15 | Payer: BC Managed Care – PPO | Primary: Family

## 2023-03-15 MED ADMIN — PERFLUTREN LIPID MICROSPHERES 1.1 MG/ML IV SUSP [79178]: 3 mL | INTRAVENOUS | @ 21:00:00 | Stop: 2023-03-15 | NDC 11994001116

## 2023-03-15 MED ADMIN — SODIUM CHLORIDE 0.9 % IJ SOLN [7319]: 10 mL | INTRAVENOUS | @ 21:00:00 | Stop: 2023-03-15 | NDC 00409488820

## 2023-03-15 NOTE — Consults
Transplant & Immunocompromised  Infectious Diseases Consult    Today's Date:  03/14/2023  Admission Date: 03/14/2023    Reason for this consultation: recurrent cellulitis    Assessment:     RLE cellulitis - probable group A strep cellulitis  -reported cellulitis associated with right diabetic foot wound (early April); treated with TMP/SMX + amox/clav at time of discharge   -sudden symptom onset morning of 4/25; Development of erythema distal RLE + chills  -Clinical picture at that time consistent with group A streptococcus cellulitis; improved very quickly with IV cefazolin; discharged on 7-day course of linezolid 600 mg p.o. twice daily  -Was to utilize CHG 2-3 times weekly + regular daytime use of compression stockings  -5/14: Acute onset of erythema/tenderness of the distal RLE (between right ankle and proximal gastroc) consistent with group A cellulitis  -5/14 ultrasound lower extremity: No DVT  -5/14 MRI lower extremity: Mild subcutaneous edema throughout the calf with associated enhancement.  No fluid collection.  No evidence of osteomyelitis.  Perivascular edema and enhancement with suggestion of small filling defects involving deep veins of the calf suggestive of thrombophlebitis.        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  No evidence of osteomyelitis.  Small cutaneous ulceration along plantar aspect of the lateral forefoot between the fourth and fifth metatarsal heads.  No drainable fluid collection or abscess.  Moderate arthrosis of first MTP joint.  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  Mild subcutaneous edema throughout the foot, greatest dorsally.  No confluent marrow signal abnormalities to suggest osteomyelitis.     Obesity     Chronic lower extremity lymphedema    Recommendations:     We will discontinue linezolid to provide additional time off of this antibiotic to prevent risk with drug related AEs, particularly if we need to go back to this oral antibiotic at the time of discharge.  We will start cefazolin 2 g IV every 8 hours  Interesting finding on MRI? Raising some concerns for possible defects within the deep veins of the calf concerning for thrombophlebitis.  Recommend repeat Doppler ultrasound with special attention paid to the proximal calf veins.  If we do see evidence of thrombophlebitis, this would necessitate a longer course of antimicrobial therapy.  Will follow-up pending blood culture results.  The patient may require chronic antimicrobial suppression based on 3 episodes of cellulitis within the last few months.  However, we will hold on making this determination until repeat ultrasound findings are known.    Thank you for the consultation.  We will be happy to follow the patient with you.          History of Present Illness    Samantha Valencia is a 27 y.o.  woman with history of type 2 diabetes mellitus, obesity and hypertension who presents with recurrent cellulitis.    She was last seen during her hospitalization late in April.  She previously reported developing a callus on the plantar aspect of her right foot that have been ongoing for several months.  She developed drainage from this wound and erythema that progressed up her posterior RLE, above the knee.  She was admitted to the hospital in early April.  She was initially started on vancomycin + pip/tazo.     4/2 MRI RLE: Minimal bone marrow edema  in the great toe tip, likely reactive.  No evidence of osteomyelitis.  Small cutaneous ulceration along plantar aspect of the lateral forefoot between the fourth and fifth metatarsal heads.  No drainable fluid collection or abscess.  Moderate arthrosis of first MTP joint.     Patient seen by Orthopedic surgery during that hospitalization.  Recommended offloading orthotic for her right foot.  Patient was discharged from the hospital on 4/4 on TMP/SMX + amox/clav, to complete a total of 2 weeks of therapy.     Patient presented back to the ED on the morning of 4/25 with complaints of RLE redness and swelling.  Sudden onset of erythema distal RLE + pain midmorning on 4/25.  Clinical presentation consistent with group A streptococcal cellulitis at that time.    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  Mild subcutaneous edema throughout the foot, greatest dorsally.  No confluent marrow signal abnormalities to suggest osteomyelitis.    She did respond quite quickly to IV cefazolin.  We recommended transitioning to linezolid 600 mg p.o. twice daily and continue to 03/03/2023.  We also recommended compression stockings + utilization of CHG to her lower extremities 2-3 times per week.    She did complete the oral antibiotic and was using the CHG as directed.    Patient states she was feeling quite well the morning, 5/12. She woke very early in the morning with substantial nausea, vomiting and diarrhea.  She vomited multiple times and then began dry-heaving.  She had some headache time.  No significant fevers.  [facility] told she either had the flu  Tested positive for the flu.  When asked if this was influenza after testing had showed.  She believes she underwent a nasal swab but we cannot confirm.  She was told to drink plenty of fluids.  At that time, she had no erythema whatsoever.    She woke shortly after midnight early this morning and had some erythema of the distal RLE, the site of her recent cellulitis.    As the day has progressed, she has developed increasing erythema and some pain at the site of the distal RLE rash, which resides between her right ankle and right gastroc.  She also has some tenderness in the medial aspect of her right thigh superficial findings.  Nominal pain.  No loose stools today.  She does reportedly compression stockings at home.  She does report several weeks sweats that come and go, apparently not just associated with these episodes of cellulitis.  This is her third hospitalization for cellulitis within the last 3 months.      Antimicrobial Start date End date   linezolid 4/26-5/3; 5/14 5/14   cefazolin 5/14 active                            Estimated Creatinine Clearance: 124.8 mL/min (based on SCr of 1 mg/dL).    Past Medical History     Medical History:   Diagnosis Date    Anxiety     Depression     Diabetes (HCC)     History of PCOS     Hypertension     Lymphedema        Past Surgical History   History reviewed. No pertinent surgical history.    Social History   Works as a Therapist, music History     Tobacco Use  Smoking status: Never    Smokeless tobacco: Never   Substance Use Topics    Alcohol use: Never       Family History     Family History   Problem Relation Age of Onset    Bipolar Disorder Mother     Schizophrenia Father        Medications   Scheduled Meds:ceFAZolin (ANCEF) injection 2 g, 2 g, Intravenous, Q8H*  enoxaparin (LOVENOX) syringe 40 mg, 40 mg, Subcutaneous, BID  FLUoxetine (PROzac) capsule 40 mg, 40 mg, Oral, QHS  furosemide (LASIX) tablet 20 mg, 20 mg, Oral, QAM8  gabapentin (NEURONTIN) capsule 100 mg, 100 mg, Oral, QHS  hydroCHLOROthiazide (HYDRODIURIL) tablet 25 mg, 25 mg, Oral, QAM8  hydrOXYzine HCL (ATARAX) tablet 25 mg, 25 mg, Oral, QHS  insulin aspart (U-100) (NOVOLOG FLEXPEN U-100 INSULIN) injection PEN 0-6 Units, 0-6 Units, Subcutaneous, ACHS (22)  insulin glargine (LANTUS SOLOSTAR U-100 INSULIN) injection PEN 15 Units, 15 Units, Subcutaneous, QHS  losartan (COZAAR) tablet 100 mg, 100 mg, Oral, QHS  magnesium oxide (MAGOX) tablet 400 mg, 400 mg, Oral, BID  metoprolol tartrate tablet 12.5 mg, 12.5 mg, Oral, BID  montelukast (SINGULAIR) tablet 10 mg, 10 mg, Oral, QHS  pantoprazole DR (PROTONIX) tablet 40 mg, 40 mg, Oral, QDAY(21)  topiramate (TOPAMAX) tablet 50 mg, 50 mg, Oral, QAM8   And  topiramate (TOPAMAX) tablet 100 mg, 100 mg, Oral, QHS    Continuous Infusions:  PRN and Respiratory Meds:acetaminophen Q6H PRN, bisacodyL QDAY PRN, dextrose 50% (D50) IV PRN, melatonin QHS PRN, ondansetron Q6H PRN **OR** ondansetron (ZOFRAN) IV Q6H PRN, oxyCODONE Q8H PRN, polyethylene glycol 3350 QDAY PRN      Allergies   No Known Allergies    Review of Systems     As outlined above. Otherwise, a comprehensive 14-point ROS was negative     Physical Examination                          Vital Signs: Last                  Vital Signs: 24 Hour Range   BP: 132/66 (05/14 1418)  Temp: 36.6 ?C (97.8 ?F) (05/14 1418)  Pulse: 109 (05/14 1418)  Respirations: 20 PER MINUTE (05/14 1418)  SpO2: 99 % (05/14 1418)  O2 Device: None (Room air) (05/14 0500)  Height: 172.7 cm (5' 8) (05/14 0500) BP: (101-143)/(60-89)   Temp:  [36.6 ?C (97.8 ?F)-36.7 ?C (98.1 ?F)]   Pulse:  [90-112]   Respirations:  [18 PER MINUTE-20 PER MINUTE]   SpO2:  [97 %-100 %]   O2 Device: None (Room air)     Gen:  awake, alert, NAD at rest  HEENT:  no icterus, EOMI, no visible thrush  Nodes:  no inguinal or cervical adenopathy  Lungs:  clear  Heart:  reg, no murmur  Abd:  + BS, soft, NT  Ext:  erythema / warmth of distal RLE (between right ankle and prox gastro), no fluctuance  Skin:  erythema / warmth of distal RLE (between right ankle and prox gastro), no fluctuance;  ulcerative lesion, dorsum of right foot, lateral aspect; no surrounding erythema; no drainage or purulence  Musculoskel:  no warm/swollen joints  Psych:  O x 3; normal mood & affect      Lines:    Lab Review   Hematology  Recent Labs     03/14/23  0644   WBC 17.7*   HGB 10.8*  HCT 31.9*   PLTCT 266     Chemistry  Recent Labs     03/14/23  0644   NA 138   K 3.7   CL 106   CO2 21   BUN 11   CR 1.00   GLU 126*   CA 8.9   ALBUMIN 3.9   ALKPHOS 34   AST 20   ALT 21   TOTBILI 0.7       Microbiology, Radiology and other Diagnostics Review   Microbiology data reviewed.    Pertinent radiology images viewed.       Rolly Pancake, MD   Contact via Amie Critchley  Pg: [phone]  Transplant & Immunocompromised Infectious Diseases

## 2023-03-16 ENCOUNTER — Encounter: Admit: 2023-03-16 | Discharge: 2023-03-16 | Payer: BC Managed Care – PPO | Primary: Family

## 2023-03-16 MED ADMIN — OXYCODONE 5 MG PO TAB [10814]: 5 mg | ORAL | @ 10:00:00 | NDC 00904696661

## 2023-03-16 MED ADMIN — OXYCODONE 5 MG PO TAB [10814]: 5 mg | ORAL | @ 19:00:00 | NDC 00904696661

## 2023-03-16 MED ADMIN — OXYCODONE 5 MG PO TAB [10814]: 5 mg | ORAL | @ 15:00:00 | NDC 00904696661

## 2023-03-16 MED ADMIN — IRON SUCROSE 100 MG IRON/5 ML IV SOLN GROUP [280019]: 300 mg | INTRAVENOUS | @ 17:00:00 | Stop: 2023-03-19 | NDC 00517234001

## 2023-03-16 MED ADMIN — OXYCODONE 5 MG PO TAB [10814]: 5 mg | ORAL | @ 23:00:00 | NDC 00904696661

## 2023-03-16 MED ADMIN — ACETAMINOPHEN 500 MG PO TAB [102]: 1000 mg | ORAL | @ 13:00:00 | NDC 00904673061

## 2023-03-16 MED ADMIN — OXYCODONE 5 MG PO TAB [10814]: 5 mg | ORAL | @ 06:00:00 | NDC 00904696661

## 2023-03-16 MED ADMIN — ACETAMINOPHEN 500 MG PO TAB [102]: 1000 mg | ORAL | @ 23:00:00 | NDC 00904673061

## 2023-03-16 MED ADMIN — OXYCODONE 5 MG PO TAB [10814]: 5 mg | ORAL | @ 02:00:00 | NDC 00904696661

## 2023-03-16 MED ADMIN — SODIUM CHLORIDE 0.9 % IV SOLP [27838]: 300 mg | INTRAVENOUS | @ 17:00:00 | Stop: 2023-03-19 | NDC 00338004902

## 2023-03-16 NOTE — Telephone Encounter
Inpatient sleep apnea screening was performed 03/15/2023 using a WatchPAT300 device.    In summary:  Apnea/hypopnea index 3% (AHI 3%) = 8.2 respiratory events per hour of sleep  Apnea/hypopnea index 4% (AHI 4%) = 4.2 respiratory events per hour of sleep  Mean oxygen saturation = 96%  Lowest oxygen saturation = 81%  Time spent with oxygen saturation < 88% = 0.2 minutes    This test has been forwarded to Dr. Truddie Crumble for review and his final interpretation will be scanned into Epic as soon as it's available. In the interim, please feel free to contact me with any questions, or if additional information is needed regarding this test.    Thank you,    Rayetta Humphrey, M.S., RPSGT  Lead Polysomnographic Technologist  Phone: [phone]   [email or web address]

## 2023-03-17 ENCOUNTER — Inpatient Hospital Stay: Admit: 2023-03-17 | Discharge: 2023-03-17 | Payer: BC Managed Care – PPO | Primary: Family

## 2023-03-17 ENCOUNTER — Encounter: Admit: 2023-03-17 | Discharge: 2023-03-17 | Payer: BC Managed Care – PPO | Primary: Family

## 2023-03-17 DIAGNOSIS — G479 Sleep disorder, unspecified: Secondary | ICD-10-CM

## 2023-03-17 MED ADMIN — SODIUM CHLORIDE 0.9 % IV SOLP [27838]: 300 mg | INTRAVENOUS | @ 18:00:00 | Stop: 2023-03-18 | NDC 00338004902

## 2023-03-17 MED ADMIN — OXYCODONE 5 MG PO TAB [10814]: 5 mg | ORAL | @ 10:00:00 | Stop: 2023-03-18 | NDC 00904696661

## 2023-03-17 MED ADMIN — ACETAMINOPHEN 500 MG PO TAB [102]: 1000 mg | ORAL | @ 21:00:00 | Stop: 2023-03-18 | NDC 00904673061

## 2023-03-17 MED ADMIN — DIPHENHYDRAMINE HCL 25 MG PO CAP [2509]: 25 mg | ORAL | @ 18:00:00 | Stop: 2023-03-18 | NDC 00904723761

## 2023-03-17 MED ADMIN — COSYNTROPIN 0.25 MG IJ SOLR [78840]: 0.25 mg | INTRAVENOUS | @ 14:00:00 | Stop: 2023-03-17 | NDC 00781344071

## 2023-03-17 MED ADMIN — IRON SUCROSE 100 MG IRON/5 ML IV SOLN GROUP [280019]: 300 mg | INTRAVENOUS | @ 18:00:00 | Stop: 2023-03-18 | NDC 00517234001

## 2023-03-17 MED ADMIN — ACETAMINOPHEN 500 MG PO TAB [102]: 1000 mg | ORAL | @ 14:00:00 | Stop: 2023-03-18 | NDC 00904673061

## 2023-03-17 MED ADMIN — OXYCODONE 5 MG PO TAB [10814]: 5 mg | ORAL | @ 03:00:00 | NDC 00904696661

## 2023-03-17 MED ADMIN — ACETAMINOPHEN 500 MG PO TAB [102]: 1000 mg | ORAL | @ 05:00:00 | NDC 00904673061

## 2023-03-17 MED ADMIN — OXYCODONE 5 MG PO TAB [10814]: 5 mg | ORAL | @ 18:00:00 | Stop: 2023-03-18 | NDC 00904696661

## 2023-03-17 MED FILL — OXYCODONE 5 MG PO TAB: 5 mg | ORAL | 4 days supply | Qty: 15 | Fill #1 | Status: CP

## 2023-03-17 MED FILL — LINEZOLID 600 MG PO TAB: 600 mg | ORAL | 8 days supply | Qty: 15 | Fill #1 | Status: CP

## 2023-03-17 MED FILL — PENICILLIN V POTASSIUM 500 MG PO TAB: 500 mg | ORAL | 30 days supply | Qty: 60 | Fill #1 | Status: CP

## 2023-03-17 NOTE — Progress Notes
Patient does not need a follow up with a Sleep provider at this time.

## 2023-03-21 ENCOUNTER — Encounter: Admit: 2023-03-21 | Discharge: 2023-03-21 | Payer: BC Managed Care – PPO | Primary: Family

## 2023-03-21 NOTE — Telephone Encounter
ID Reconciliation Note: 03/21/23    Per DR Nelda Severe pt discharged on Linezolid 600 mg p.o. twice daily to complete a 2-week course of therapy following discharge. (Stop date 5/28) (dispensed)  On 5/29, plan to transition to Penicillin VK 500 mg p.o. twice daily as chronic suppressive therapy. (#30 day supply dispensed and has 2 refills)     DX: RLE cellulitis   F/U with ID TBD.

## 2023-03-24 ENCOUNTER — Encounter: Admit: 2023-03-24 | Discharge: 2023-03-24 | Payer: BC Managed Care – PPO | Primary: Family

## 2023-03-24 ENCOUNTER — Ambulatory Visit: Admit: 2023-03-24 | Discharge: 2023-03-25 | Payer: BC Managed Care – PPO | Primary: Family

## 2023-03-24 DIAGNOSIS — F32A Depression: Secondary | ICD-10-CM

## 2023-03-24 DIAGNOSIS — E11621 Type 2 diabetes mellitus with foot ulcer: Secondary | ICD-10-CM

## 2023-03-24 DIAGNOSIS — I10 Essential (primary) hypertension: Secondary | ICD-10-CM

## 2023-03-24 DIAGNOSIS — E119 Type 2 diabetes mellitus without complications: Secondary | ICD-10-CM

## 2023-03-24 DIAGNOSIS — F419 Anxiety disorder, unspecified: Secondary | ICD-10-CM

## 2023-03-24 DIAGNOSIS — I890 Lymphedema, not elsewhere classified: Secondary | ICD-10-CM

## 2023-03-24 DIAGNOSIS — Z8742 Personal history of other diseases of the female genital tract: Secondary | ICD-10-CM

## 2023-03-24 MED ORDER — LINEZOLID 600 MG PO TAB
600 mg | ORAL_TABLET | Freq: Two times a day (BID) | ORAL | 0 refills | Status: AC
Start: 2023-03-24 — End: ?
  Filled 2023-04-07: qty 11, 6d supply, fill #1

## 2023-03-24 NOTE — Progress Notes
Name:Samantha Valencia           MRN: [id number]                 DOB:[birth year]          Age: 27 y.o.  Date of Service: 03/24/2023    Subjective:            Past Medical History:   Diagnosis Date    Anxiety     Depression     Diabetes (HCC)     History of PCOS     Hypertension     Lymphedema        No past surgical history on file.    family history includes Bipolar Disorder in her mother; Schizophrenia in her father.    Social History     Socioeconomic History    Marital status: Single   Tobacco Use    Smoking status: Never    Smokeless tobacco: Never   Substance and Sexual Activity    Alcohol use: Never    Drug use: Never                       History of Present Illness    Samantha Valencia is a 27 y.o. female. Pt with a history of diabetes presents with wound on bottom of right foot. Pt had been in the hospital for recurrent right lower extremity cellulitis. On initial presentation, wound had thick callus in place but pt stated that she could feel pain under callus. Callus debrided in clinic revealing a fissure present. No signs of infection noted at that time. Pt was wearing an offloading boot for a majority of the day. Pt with a CGM and last A1C on 01/31/23 was 6.0%. Pt does not smoke. Pt eats a fair amount of protein in her diet. Regularly wears compression. Had MRI on 02/24/23 which showed no signs of osteomyelitis. Pt was in hospital in mid-May for cellulitis. ID recommended suppressive antibiotics.    Review of Systems   Skin:  Positive for wound.      Objective:          cholecalciferol (vitamin D3) (VITAMIN D3 PO) Take 2 Gummy by mouth daily. 2 gummies = 2000 IU    ferrous sulfate (FEOSOL) 325 mg (65 mg iron) tablet Take one tablet by mouth three times weekly. Take on an empty stomach at least 1 hour before or 2 hours after food.  Indications: anemia from inadequate iron    fluoxetine (PROZAC) 40 mg capsule Take one capsule by mouth at bedtime daily.    furosemide (LASIX) 20 mg tablet Take one tablet by mouth every morning.    gabapentin (NEURONTIN) 100 mg capsule Take one capsule by mouth twice daily as needed.    hydroCHLOROthiazide (HYDRODIURIL) 25 mg tablet Take one tablet by mouth every morning.    hydrOXYzine HCL (ATARAX) 25 mg tablet Take one tablet by mouth at bedtime daily. Indications: anxious    ibuprofen (MOTRIN) 800 mg tablet Take one tablet by mouth every 8 hours as needed for Pain. Take with food.    insulin glargine(+) (LANTUS U-100 INSULIN) 100 unit/mL subcutaneous solution Inject fifteen Units under the skin at bedtime daily. Indications: type 2 diabetes mellitus    linezolid (ZYVOX) 600 mg tablet Take one tablet by mouth twice daily for 11 days. Indications: skin and skin structure infection    losartan (COZAAR) 100 mg tablet Take one tablet by mouth at bedtime  daily.    magnesium oxide (MAGOX) 400 mg (241.3 mg magnesium) tablet Take one tablet by mouth twice daily.    medroxyPROGESTERone (DEPO-PROVERA) 150 mg/mL injection Inject 1 mL into the muscle every 90 days.    melatonin 5 mg chew Chew 3 Gummy by mouth at bedtime as needed.    metFORMIN (GLUCOPHAGE) 1,000 mg tablet Take one tablet by mouth twice daily with meals.    metoprolol tartrate 25 mg tablet Take one-half tablet by mouth twice daily.    montelukast (SINGULAIR) 10 mg tablet Take one tablet by mouth at bedtime daily.    omeprazole DR (PRILOSEC) 20 mg capsule Take one capsule by mouth daily before breakfast.    other medication Medication Name & Strength: [place] Probiotic Multi-Enzyme Digestive Formula Tablets    Dose(how many): 3 tablets    Frequency(how often): daily    other medication Medication Name & Strength: AZO Yeast Plus    Dose(how many): 1 tablet    Frequency(how often): three times daily as needed    oxyCODONE (ROXICODONE) 5 mg tablet Take one tablet by mouth every 6 hours as needed. Indications: pain    oxyCODONE (ROXICODONE) 5 mg tablet Take one tablet by mouth every 8 hours as needed for Pain. Indications: pain    oxymetazoline (AFRIN EXTRA MOISTURIZING) 0.05 % nasal spray Apply two sprays to each nostril as directed daily as needed (allergies). Max of 3 days.    OZEMPIC 1 mg/dose (4 mg/3 mL) injection PEN Inject one mg under the skin every 7 days.    [START ON 03/28/2023] penicillin V potassium (VEETIDS) 500 mg tablet Take one tablet by mouth twice daily. Start after linezolid is finished.  Indications: skin and skin structure infection    PROAIR HFA 90 mcg/actuation inhaler Inhale two puffs by mouth into the lungs every 4 hours as needed.    prochlorperazine maleate (COMPAZINE) 10 mg tablet Take one tablet by mouth every 8 hours as needed for Other... (take with benadryl as needed for migraine headache).    riboflavin (vitamin B2) 400 mg tablet Take one tablet by mouth daily.    SUMAtriptan succinate (IMITREX) 100 mg tablet Take one tablet by mouth at onset of headache. May repeat after 2 hours if needed. Max of 200 mg in 24 hours.    tiZANidine (ZANAFLEX) 4 mg tablet Take one-half tablet by mouth daily as needed.    topiramate (TOPAMAX) 50 mg tablet Take 50mg  in the morning and 100mg  at night for total of 150mg  daily.     Vitals:    03/24/23 1004   BP: (!) 128/110   BP Source: Arm, Left Upper   Pulse: 82   Temp: 36.7 ?C (98.1 ?F)   Resp: 20   SpO2: 100%   TempSrc: Oral   PainSc: Four     There is no height or weight on file to calculate BMI.   Physical Exam  Constitutional:       Appearance: Normal appearance.   Eyes:      Conjunctiva/sclera: Conjunctivae normal.      Pupils: Pupils are equal, round, and reactive to light.   Cardiovascular:      Rate and Rhythm: Normal rate.   Pulmonary:      Effort: Pulmonary effort is normal.   Musculoskeletal:         General: Normal range of motion.      Cervical back: Normal range of motion.   Skin:     General: Skin is warm  and dry.   Neurological:      Mental Status: She is alert. Mental status is at baseline.   Psychiatric:         Mood and Affect: Mood normal. Thought Content: Thought content normal.           Wounds Pressure injury Right;Plantar (Active)   03/07/23 0935   Wound Type: Pressure injury   Orientation: Right;Plantar   Location:    Wound Location Comments:    Initial Wound Site Closure:    Initial Dressing Placed:    Initial Cycle:    Initial Suction Setting (mmHg):    Pressure Injury Stages:    Pressure Injury Present Within 24 Hours of Hospital Admission:    If This Pressure Injury Is Suspected to Be Device Related, Please Select the Device::    Is the Wound Open or Closed:    Image   03/24/23 1000   Wound Assessment Dry;Scab 03/24/23 1000   Peri-wound Assessment Callous 03/24/23 1000   Wound Drainage Amount None 03/24/23 1000   Wound Dressing Status None/open to air 03/07/23 0900   Wound Care Dressing changed or new application 03/24/23 1000   Wound Dressing and/or Treatment Hydrofera blue ready;Hypafix tape 03/07/23 1000   Wound Status (Wound Team Only) Being Treated 03/24/23 1000   Wound Length (cm) (Wound Team Only) 1.5 cm 03/24/23 1000   Wound Width (cm) (Wound Team Only) 1.2 cm 03/24/23 1000   Wound Depth (cm) (Wound Team Only) 0.6 cm 03/24/23 1000   Wound Surface Area (cm^2) (Wound Team Only) 1.8 cm^2 03/24/23 1000   Wound Volume (cm^3) (Wound Team Only) 1.08 cm^3 03/24/23 1000   Wound Healing % (Wound Team Only) -4400 03/24/23 1000   Undermining in CM (Wound Team Only) 0 cm 03/24/23 1000   Tunneling in CM (Wound Team Only) 0 cm 03/24/23 1000   Number of days: 17                       ALL wounds debrided using: sharp     Assessment and Plan:  Diabetic foot ulcer, right plantar foot, fat layer exposed  - Hydrofera blue dressing, secure with hypafix, change daily  - Keep clean and dry  - Monitor and report any signs of infection  - Continue antibiotics per ID  - Continue offloading in boot  - Compression daily  - Maintain adequate glucose levels  - Protein in diet  - Follow-up in 2 weeks    Total Time Today was 20 minutes in the following activities: Preparing to see the patient, Performing a medically appropriate examination and/or evaluation, Counseling and educating the patient/family/caregiver, and Documenting clinical information in the electronic or other health record

## 2023-03-24 NOTE — Progress Notes
A prior authorization (PA) was submitted for linezolid for [name] via covermymeds on 03/24/2023. The covermymeds key is [id number].     If PA is not approved by the time she picks up the prescription from [place], she will use [facility] coupon. Spoke to patient who verbalized understanding of this plan.     Samantha Valencia

## 2023-03-27 ENCOUNTER — Encounter: Admit: 2023-03-27 | Discharge: 2023-03-27 | Payer: BC Managed Care – PPO | Primary: Family

## 2023-03-27 NOTE — Progress Notes
Fax received from [facility] stating Samantha Valencia 's prior authorization (PA) for linezolid was approved from 03/26/23 to 04/25/23.

## 2023-04-02 ENCOUNTER — Encounter: Admit: 2023-04-02 | Discharge: 2023-04-02 | Payer: BC Managed Care – PPO | Primary: Family

## 2023-04-03 ENCOUNTER — Encounter: Admit: 2023-04-03 | Discharge: 2023-04-03 | Payer: BC Managed Care – PPO | Primary: Family

## 2023-04-05 ENCOUNTER — Emergency Department: Admit: 2023-04-05 | Discharge: 2023-04-05 | Payer: BC Managed Care – PPO

## 2023-04-05 DIAGNOSIS — A419 Sepsis, unspecified organism: Secondary | ICD-10-CM

## 2023-04-05 DIAGNOSIS — L03115 Cellulitis of right lower limb: Secondary | ICD-10-CM

## 2023-04-05 DIAGNOSIS — L0390 Cellulitis, unspecified: Secondary | ICD-10-CM

## 2023-04-05 LAB — COMPREHENSIVE METABOLIC PANEL
ALBUMIN: 4.5 g/dL — ABNORMAL HIGH (ref 3.5–5.0)
ALK PHOSPHATASE: 38 U/L — ABNORMAL LOW (ref 25–110)
ALT: 15 U/L (ref 7–56)
ANION GAP: 14 K/UL — ABNORMAL HIGH (ref 3–12)
AST: 14 U/L (ref 7–40)
BLD UREA NITROGEN: 17 mg/dL (ref 7–25)
CALCIUM: 9.6 mg/dL — ABNORMAL HIGH (ref 8.5–10.6)
CHLORIDE: 98 MMOL/L (ref 98–110)
CO2: 21 MMOL/L (ref 21–30)
EGFR: >60 mL/min (ref >60)
GLUCOSE,PANEL: 123 mg/dL — ABNORMAL HIGH (ref 70–100)
POTASSIUM: 3.3 MMOL/L — ABNORMAL LOW (ref 3.5–5.1)
SODIUM: 133 MMOL/L — ABNORMAL LOW (ref 137–147)
TOTAL BILIRUBIN: 0.9 mg/dL (ref 0.2–1.3)
TOTAL PROTEIN: 8.1 g/dL — ABNORMAL HIGH (ref 6.0–8.0)

## 2023-04-05 LAB — MAGNESIUM: MAGNESIUM: 1.9 mg/dL — ABNORMAL HIGH (ref 1.6–2.6)

## 2023-04-05 LAB — CBC AND DIFF
ABSOLUTE EOS COUNT: 0 K/UL (ref 0–0.45)
ABSOLUTE MONO COUNT: 0.8 K/UL — ABNORMAL HIGH (ref 0–0.80)
WBC COUNT: 18 K/UL — ABNORMAL HIGH (ref 4.5–11.0)

## 2023-04-05 LAB — POC CREATININE, RAD: CREATININE, POC: 1.1 mg/dL — ABNORMAL HIGH (ref 0.4–1.00)

## 2023-04-05 LAB — POC GLUCOSE: POC GLUCOSE: 132 mg/dL — ABNORMAL HIGH (ref 70–100)

## 2023-04-05 LAB — POC LACTATE: LACTIC ACID POC: 1.5 MMOL/L (ref 0.5–2.0)

## 2023-04-05 MED ORDER — POLYETHYLENE GLYCOL 3350 17 GRAM PO PWPK
1 | Freq: Every day | ORAL | 0 refills | PRN
Start: 2023-04-05 — End: ?

## 2023-04-05 MED ORDER — MELATONIN 5 MG PO TAB
5 mg | Freq: Every evening | ORAL | 0 refills | PRN
Start: 2023-04-05 — End: ?

## 2023-04-05 MED ORDER — ONDANSETRON 4 MG PO TBDI
4 mg | ORAL | 0 refills | PRN
Start: 2023-04-05 — End: ?

## 2023-04-05 MED ORDER — POTASSIUM CHLORIDE IN WATER 10 MEQ/50 ML IV PGBK
10 meq | Freq: Once | INTRAVENOUS | 0 refills | Status: AC
Start: 2023-04-05 — End: ?
  Administered 2023-04-06: 04:00:00 10 meq via INTRAVENOUS

## 2023-04-05 MED ORDER — FENTANYL CITRATE (PF) 50 MCG/ML IJ SOLN
50 ug | INTRAVENOUS | 0 refills | Status: AC | PRN
Start: 2023-04-05 — End: ?
  Administered 2023-04-06 (×2): 50 ug via INTRAVENOUS

## 2023-04-05 MED ORDER — ONDANSETRON HCL (PF) 4 MG/2 ML IJ SOLN
4 mg | INTRAVENOUS | 0 refills | PRN
Start: 2023-04-05 — End: ?

## 2023-04-05 MED ORDER — LACTATED RINGERS IV SOLP
30 mL/kg | INTRAVENOUS | 0 refills | Status: CP
Start: 2023-04-05 — End: ?
  Administered 2023-04-06: 02:00:00 1917 mL via INTRAVENOUS

## 2023-04-05 MED ORDER — SENNOSIDES-DOCUSATE SODIUM 8.6-50 MG PO TAB
1 | Freq: Every day | ORAL | 0 refills | PRN
Start: 2023-04-05 — End: ?

## 2023-04-05 MED ORDER — ENOXAPARIN 40 MG/0.4 ML SC SYRG
40 mg | Freq: Every day | SUBCUTANEOUS | 0 refills
Start: 2023-04-05 — End: ?

## 2023-04-05 MED ORDER — VANCOMYCIN 2,500 MG IVPB
2500 mg | Freq: Once | INTRAVENOUS | 0 refills | Status: CP
Start: 2023-04-05 — End: ?
  Administered 2023-04-06 (×2): 2500 mg via INTRAVENOUS

## 2023-04-05 MED ORDER — ACETAMINOPHEN 500 MG PO TAB
1000 mg | Freq: Once | ORAL | 0 refills | Status: CP
Start: 2023-04-05 — End: ?
  Administered 2023-04-06: 02:00:00 1000 mg via ORAL

## 2023-04-06 ENCOUNTER — Encounter: Admit: 2023-04-06 | Discharge: 2023-04-06 | Payer: BC Managed Care – PPO | Primary: Family

## 2023-04-06 ENCOUNTER — Inpatient Hospital Stay: Admit: 2023-04-06 | Payer: BC Managed Care – PPO

## 2023-04-06 ENCOUNTER — Inpatient Hospital Stay: Admit: 2023-04-06 | Discharge: 2023-04-06 | Payer: BC Managed Care – PPO | Primary: Family

## 2023-04-06 DIAGNOSIS — F32A Depression: Secondary | ICD-10-CM

## 2023-04-06 DIAGNOSIS — Z8742 Personal history of other diseases of the female genital tract: Secondary | ICD-10-CM

## 2023-04-06 DIAGNOSIS — I10 Essential (primary) hypertension: Secondary | ICD-10-CM

## 2023-04-06 DIAGNOSIS — E119 Type 2 diabetes mellitus without complications: Secondary | ICD-10-CM

## 2023-04-06 DIAGNOSIS — F419 Anxiety disorder, unspecified: Secondary | ICD-10-CM

## 2023-04-06 DIAGNOSIS — I890 Lymphedema, not elsewhere classified: Secondary | ICD-10-CM

## 2023-04-06 MED ADMIN — CEFAZOLIN 2 GRAM IV SOLR [462609]: 2 g | INTRAVENOUS | @ 16:00:00 | Stop: 2023-04-06 | NDC 00143913901

## 2023-04-06 MED ADMIN — CHOLECALCIFEROL (VITAMIN D3) 25 MCG (1,000 UNIT) PO TAB [130074]: 2000 [IU] | ORAL | @ 15:00:00 | NDC 80681016900

## 2023-04-06 MED ADMIN — OXYCODONE 5 MG PO TAB [10814]: 5 mg | ORAL | @ 11:00:00 | Stop: 2023-04-06 | NDC 00904696661

## 2023-04-06 MED ADMIN — POTASSIUM CHLORIDE 20 MEQ PO TBTQ [35943]: 60 meq | ORAL | @ 11:00:00 | Stop: 2023-04-06 | NDC 00832532510

## 2023-04-06 MED ADMIN — POTASSIUM CHLORIDE IN WATER 10 MEQ/50 ML IV PGBK [11075]: 10 meq | INTRAVENOUS | @ 20:00:00 | Stop: 2023-04-06 | NDC 00338070541

## 2023-04-06 MED ADMIN — ENOXAPARIN 40 MG/0.4 ML SC SYRG [85052]: 40 mg | SUBCUTANEOUS | @ 15:00:00 | NDC 00781324602

## 2023-04-06 MED ADMIN — METOPROLOL TARTRATE 25 MG PO TAB [37637]: 25 mg | ORAL | @ 16:00:00 | NDC 51079025501

## 2023-04-06 MED ADMIN — GABAPENTIN 100 MG PO CAP [18309]: 100 mg | ORAL | @ 15:00:00 | NDC 00904666561

## 2023-04-06 MED ADMIN — POTASSIUM CHLORIDE IN WATER 10 MEQ/50 ML IV PGBK [11075]: 10 meq | INTRAVENOUS | @ 15:00:00 | Stop: 2023-04-06 | NDC 00338070541

## 2023-04-06 MED ADMIN — ACETAMINOPHEN 325 MG PO TAB [101]: 650 mg | ORAL | @ 11:00:00 | NDC 00904677361

## 2023-04-06 MED ADMIN — OXYCODONE 5 MG PO TAB [10814]: 5 mg | ORAL | @ 22:00:00 | NDC 00406055223

## 2023-04-06 MED ADMIN — OXYCODONE 5 MG PO TAB [10814]: 5 mg | ORAL | @ 17:00:00 | NDC 00406055223

## 2023-04-06 MED ADMIN — HYDROCHLOROTHIAZIDE 25 MG PO TAB [3720]: 25 mg | ORAL | @ 16:00:00 | NDC 23155000801

## 2023-04-06 MED ADMIN — TOPIRAMATE 25 MG PO TAB [78823]: 50 mg | ORAL | @ 15:00:00 | NDC 68382013814

## 2023-04-06 MED ADMIN — ACETAMINOPHEN 325 MG PO TAB [101]: 650 mg | ORAL | @ 16:00:00 | NDC 00904677361

## 2023-04-06 MED ADMIN — LIDOCAINE 5 % TP PTMD [80759]: 1 | TOPICAL | @ 17:00:00 | NDC 00591352511

## 2023-04-06 MED ADMIN — ACETAMINOPHEN 325 MG PO TAB [101]: 650 mg | ORAL | @ 22:00:00 | NDC 00904677361

## 2023-04-06 NOTE — ED Notes
Pt to US via cart.

## 2023-04-07 ENCOUNTER — Encounter: Admit: 2023-04-07 | Discharge: 2023-04-07 | Payer: BC Managed Care – PPO | Primary: Family

## 2023-04-07 MED ADMIN — ACETAMINOPHEN 325 MG PO TAB [101]: 650 mg | ORAL | @ 02:00:00 | NDC 00904677361

## 2023-04-07 MED ADMIN — TOPIRAMATE 100 MG PO TAB [82418]: 100 mg | ORAL | @ 01:00:00 | NDC 68382014014

## 2023-04-07 MED ADMIN — OXYCODONE 5 MG PO TAB [10814]: 5 mg | ORAL | @ 02:00:00 | NDC 00406055223

## 2023-04-07 MED ADMIN — GABAPENTIN 100 MG PO CAP [18309]: 100 mg | ORAL | @ 01:00:00 | NDC 00904666561

## 2023-04-07 MED ADMIN — OXYCODONE 5 MG PO TAB [10814]: 5 mg | ORAL | @ 13:00:00 | Stop: 2023-04-08 | NDC 00406055223

## 2023-04-07 MED ADMIN — ENOXAPARIN 40 MG/0.4 ML SC SYRG [85052]: 40 mg | SUBCUTANEOUS | @ 13:00:00 | Stop: 2023-04-08 | NDC 00781324602

## 2023-04-07 MED ADMIN — INSULIN GLARGINE 100 UNIT/ML (3 ML) SC INJ PEN [163596]: 15 [IU] | SUBCUTANEOUS | @ 02:00:00 | NDC 00088221901

## 2023-04-07 MED ADMIN — MONTELUKAST 10 MG PO TAB [81627]: 10 mg | ORAL | @ 01:00:00 | NDC 00904680861

## 2023-04-07 MED ADMIN — OXYCODONE 5 MG PO TAB [10814]: 5 mg | ORAL | @ 18:00:00 | Stop: 2023-04-08 | NDC 00406055223

## 2023-04-07 MED ADMIN — FUROSEMIDE 40 MG PO TAB [3295]: 20 mg | ORAL | @ 13:00:00 | Stop: 2023-04-07 | NDC 00904717861

## 2023-04-07 MED ADMIN — FLUOXETINE 20 MG PO CAP [10070]: 40 mg | ORAL | @ 01:00:00 | NDC 65862019301

## 2023-04-07 MED ADMIN — METOPROLOL TARTRATE 25 MG PO TAB [37637]: 25 mg | ORAL | @ 13:00:00 | Stop: 2023-04-08 | NDC 62332011231

## 2023-04-07 MED ADMIN — HYDROXYZINE HCL 25 MG PO TAB [3774]: 25 mg | ORAL | @ 01:00:00 | NDC 00904661761

## 2023-04-07 MED ADMIN — CEPHALEXIN 500 MG PO CAP [9500]: 500 mg | ORAL | @ 13:00:00 | Stop: 2023-04-07 | NDC 50268015211

## 2023-04-07 MED ADMIN — DIPHENHYDRAMINE HCL 25 MG PO CAP [2509]: 25 mg | ORAL | @ 22:00:00 | Stop: 2023-04-07 | NDC 00904723761

## 2023-04-07 MED ADMIN — ENOXAPARIN 40 MG/0.4 ML SC SYRG [85052]: 40 mg | SUBCUTANEOUS | @ 01:00:00 | NDC 00781324602

## 2023-04-07 MED ADMIN — CEFAZOLIN 3 GRAM IV SOLR [462610]: 3 g | INTRAVENOUS | @ 01:00:00 | NDC 00143914001

## 2023-04-07 MED ADMIN — ACETAMINOPHEN 325 MG PO TAB [101]: 650 mg | ORAL | @ 18:00:00 | Stop: 2023-04-08 | NDC 00904677361

## 2023-04-07 MED ADMIN — CEFAZOLIN 3 GRAM IV SOLR [462610]: 3 g | INTRAVENOUS | @ 17:00:00 | Stop: 2023-04-08 | NDC 00143914001

## 2023-04-07 MED ADMIN — HYDROCHLOROTHIAZIDE 25 MG PO TAB [3720]: 25 mg | ORAL | @ 13:00:00 | Stop: 2023-04-08 | NDC 23155000801

## 2023-04-07 MED ADMIN — CHOLECALCIFEROL (VITAMIN D3) 25 MCG (1,000 UNIT) PO TAB [130074]: 2000 [IU] | ORAL | @ 13:00:00 | Stop: 2023-04-08 | NDC 80681016900

## 2023-04-07 MED ADMIN — DIPHENHYDRAMINE HCL 25 MG PO CAP [2509]: 25 mg | ORAL | @ 15:00:00 | Stop: 2023-04-07 | NDC 00904723761

## 2023-04-07 MED ADMIN — FERROUS SULFATE 325 MG (65 MG IRON) PO TAB [3074]: 325 mg | ORAL | @ 13:00:00 | Stop: 2023-04-08 | NDC 00904759161

## 2023-04-07 MED ADMIN — ACETAMINOPHEN 325 MG PO TAB [101]: 650 mg | ORAL | @ 13:00:00 | Stop: 2023-04-08 | NDC 00904677361

## 2023-04-07 MED ADMIN — TOPIRAMATE 25 MG PO TAB [78823]: 50 mg | ORAL | @ 13:00:00 | Stop: 2023-04-08 | NDC 68382013814

## 2023-04-07 MED ADMIN — GABAPENTIN 100 MG PO CAP [18309]: 100 mg | ORAL | @ 13:00:00 | Stop: 2023-04-08 | NDC 00904666561

## 2023-04-07 MED ADMIN — CEFAZOLIN 3 GRAM IV SOLR [462610]: 3 g | INTRAVENOUS | @ 08:00:00 | Stop: 2023-04-08 | NDC 00143914001

## 2023-04-07 MED FILL — IBUPROFEN 800 MG PO TAB: 800 mg | ORAL | 5 days supply | Qty: 15 | Fill #1 | Status: CP

## 2023-04-07 MED FILL — TRAMADOL 50 MG PO TAB: 50 mg | ORAL | 4 days supply | Qty: 10 | Fill #1 | Status: CP

## 2023-04-07 MED FILL — CLINDAMYCIN HCL 300 MG PO CAP: 300 mg | ORAL | 30 days supply | Qty: 90 | Fill #1 | Status: CP

## 2023-04-09 ENCOUNTER — Encounter: Admit: 2023-04-09 | Discharge: 2023-04-09 | Payer: BC Managed Care – PPO | Primary: Family

## 2023-04-10 ENCOUNTER — Encounter: Admit: 2023-04-10 | Discharge: 2023-04-10 | Payer: BC Managed Care – PPO | Primary: Family

## 2023-04-10 NOTE — Telephone Encounter
PO ID Discharge Reconciliation Note  Hosp DC Date: 04/07/23    ID Dr. Mikki Harbor    ID f/u: TBD in 2-3 months    Dx: Recurrent RLE cellulitis    PO Abx:   --Linezolid 600mg  PO BID  End date: 04/12/23    FUTURE START:  --Clindamycin 1g PO TID for suppressive therapy  Start date: 04/13/23  End date: TBD    Verified via chart review that medications have been picked up from [facility]

## 2023-04-11 ENCOUNTER — Encounter: Admit: 2023-04-11 | Discharge: 2023-04-11 | Payer: BC Managed Care – PPO | Primary: Family

## 2023-04-11 ENCOUNTER — Ambulatory Visit: Admit: 2023-04-11 | Discharge: 2023-04-12 | Payer: BC Managed Care – PPO | Primary: Family

## 2023-04-11 DIAGNOSIS — E119 Type 2 diabetes mellitus without complications: Secondary | ICD-10-CM

## 2023-04-11 DIAGNOSIS — D539 Nutritional anemia, unspecified: Secondary | ICD-10-CM

## 2023-04-11 DIAGNOSIS — B999 Unspecified infectious disease: Secondary | ICD-10-CM

## 2023-04-11 DIAGNOSIS — I10 Essential (primary) hypertension: Secondary | ICD-10-CM

## 2023-04-11 DIAGNOSIS — F32A Depression: Secondary | ICD-10-CM

## 2023-04-11 DIAGNOSIS — T50905A Adverse effect of unspecified drugs, medicaments and biological substances, initial encounter: Secondary | ICD-10-CM

## 2023-04-11 DIAGNOSIS — R079 Chest pain, unspecified: Secondary | ICD-10-CM

## 2023-04-11 DIAGNOSIS — R42 Dizziness and giddiness: Secondary | ICD-10-CM

## 2023-04-11 DIAGNOSIS — I890 Lymphedema, not elsewhere classified: Secondary | ICD-10-CM

## 2023-04-11 DIAGNOSIS — R000 Tachycardia, unspecified: Secondary | ICD-10-CM

## 2023-04-11 DIAGNOSIS — E7800 Pure hypercholesterolemia, unspecified: Secondary | ICD-10-CM

## 2023-04-11 DIAGNOSIS — F419 Anxiety disorder, unspecified: Secondary | ICD-10-CM

## 2023-04-11 DIAGNOSIS — Z8742 Personal history of other diseases of the female genital tract: Secondary | ICD-10-CM

## 2023-04-11 NOTE — Telephone Encounter
Post Hospital Discharge Patient    Date of Discharge: 04/07/2023    Discharging Physician: Dr. Chauncey Cruel    Situation: Patient's return to work letter uploaded to MyChart has an incidental typo in it that states she was released 04/27/2023 instead of 04/07/2023. Dr. Beryle Beams contacted on [place], states he will correct and upload today.     Follow Up Required: None

## 2023-04-11 NOTE — Progress Notes
Cardiology Consultation Note     Date of Service: 04/11/2023    History of Present Illness          History of present illness: Samantha Valencia is a very pleasant 27 y.o. female who is being seen at the [facility] [place] Cardiovascular Medicine Department at the  [place]  office.      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  Patient has had multiple admissions for a right diabetic foot ulcer requiring antibiotics.  She has had poor healing due to persistent lymphedema and has been discharged on suppressive antibiotics.  She has undergone arterial and venous Doppler ultrasounds which have been normal.  During her hospitalizations she has had ECGs which demonstrated T wave abnormalities and nonspecific ST-T wave changes.  She had an echocardiogram which revealed normal biventricular function with an LVEF of 61% and normal valvular function.  She is referred to cardiology for further evaluation of an abnormal ECG.    She presents today for cardiac evaluation.  She notes occasional chest heaviness both with rest and with exertion however does appear to be more prominent with exertion.  She works at the [employer] and walks around the campus about 3 times a day.  She will notice occasional chest heaviness that lasts for a few minutes with exertion and then resolves spontaneously.  She has noticed chronic palpitations lasting for a few minutes over the last year or so.  They typically happen without any provocation and primarily occur at rest.  They have not been increasing in duration.  She does not note any noticeable exertional dyspnea.  She notes occasional orthostasis.  She denies orthopnea/PND.  She has chronic lower extremity edema secondary to lymphedema.    She has a history of hypertension and is currently on hydrochlorothiazide, losartan, and metoprolol tartrate.  She has a history of diabetes, most recent A1c was 6.0 and she is on insulin as well as Ozempic.  She follows with [facility].  She has a history of hyperlipidemia and was recently started on atorvastatin.  She denies history of tobacco use.  No family history of premature coronary atherosclerosis.      RECENT EVENTS/SYMPTOMS: 01/31/2023-EKG- mentioned T wave abnormality, consider inferior ischemia     PERTINENT CARDIAC HISTORY: Hypertension hyperlipidemia     OTHER MEDICAL HISTORY: Diabetes, cellulities, lymphedema, migraines, right leg cellulitis     PERTINENT CARDIAC FAMILY HISTORY: PGF with valve replacement, MGF with PPM. No family history of coronary artery disease.      SOCIAL HISTORY: No tobacco use, no recreational drugs use, no EtOH use. No caffeine use.      LAB: 03/15/2023-Lipid-Cholesterol 171, Triglycerides 181, HDL 32, LDL 131, VLDL 36, Non HDL Chol 139.      MOST RECENT PERTINENT TESTING/PROCEDURES:   03/15/2023-ECHO    The left ventricular size is normal. The volume measurements of the left ventricle are mildly increased. The left ventricular wall thickness is normal. Normal geometry. The left ventricular systolic function is normal. The ejection fraction by Simpson's biplane method is 61%. There are no segmental wall motion abnormalities. Normal left ventricular diastolic function. Normal left atrial pressure.    The right ventricular size is normal. The right ventricular systolic function is normal. The pulmonary artery pressure could not be estimated due to inadequate tricuspid regurgitation signal.    Biatrial findings are grossly normal.    No significant  valvular abnormalities identified.    The aortic root and ascending aorta are normal in size.    No pericardial effusion.     03/17/2023-US Doppler Venous Right  No femoral or popliteal deep venous thrombosis in the right leg. The   visualized portions of the calf veins are patent (though evaluation of the   peroneal vein is somewhat limited).      02/01/2023: Right lower extremity arterial duplex study: Normal right lower extremity arterial Doppler exam.      Most recent results for 12-Lead ECG   [facility] MAIN ECG TRIAGE ONLY    Collection Time: 04/05/23  7:41 PM   Result Value Status    VENTRICULAR RATE 115 Final    P-R INTERVAL 144 Final    QRS DURATION 82 Final    Q-T INTERVAL 338 Final    QTC CALCULATION (BAZETT) 467 Final    P AXIS 33 Final    R AXIS 53 Final    T AXIS 23 Final    Impression    Sinus tachycardia  ST & T wave abnormality, consider inferior ischemia  Abnormal ECG  When compared with ECG of 14-Mar-2023 07:00,  Inverted T waves have replaced nonspecific T wave abnormality in Inferior leads  Inverted T waves have replaced nonspecific T wave abnormality in Anterior leads  Confirmed by Milagros Loll ([id number]) on 04/05/2023 9:19:58 PM   Most recent results for 12-Lead ECG   ECG 12-LEAD    Collection Time: 03/14/23  7:00 AM   Result Value Status    VENTRICULAR RATE 93 Final    P-R INTERVAL 152 Final    QRS DURATION 84 Final    Q-T INTERVAL 358 Final    QTC CALCULATION (BAZETT) 445 Final    P AXIS 36 Final    R AXIS 61 Final    T AXIS 19 Final    Impression    Normal sinus rhythm  Nonspecific ST and T wave abnormality  Abnormal ECG  When compared with ECG of 31-Jan-2023 10:29,  No significant change was found  Confirmed by Francetta Found ([id number]) on 03/14/2023 7:04:48 AM            Current Medications (including today's revisions)   acetaminophen (TYLENOL) 325 mg tablet Take two tablets by mouth every 4 hours as needed. Indications: pain    atorvastatin (LIPITOR) 10 mg tablet Take one tablet by mouth daily.    cholecalciferol (vitamin D3) (VITAMIN D3 PO) Take 2 Gummy by mouth daily. 2 gummies = 2000 IU    [START ON 04/13/2023] clindamycin HCL (CLEOCIN HCL) 300 mg capsule Take one capsule by mouth three times daily. Start taking after 6/12 completion of linezolid     Take with 8oz of water.  Indications: skin infection due to anaerobic bacteria    ferrous sulfate (FEOSOL) 325 mg (65 mg iron) tablet Take one tablet by mouth three times weekly. Take on an empty stomach at least 1 hour before or 2 hours after food.  Indications: anemia from inadequate iron    FLORAJEN DIGESTION 15 billion cell cap     fluoxetine (PROZAC) 40 mg capsule Take one capsule by mouth at bedtime daily.    furosemide (LASIX) 20 mg tablet Take one tablet by mouth every morning. Indications: visible water retention    gabapentin (NEURONTIN) 100 mg capsule Take one capsule by mouth twice daily as needed.    hydroCHLOROthiazide (HYDRODIURIL) 25 mg tablet Take one tablet by mouth every morning.  hydrOXYzine HCL (ATARAX) 25 mg tablet Take one tablet by mouth at bedtime daily. Indications: anxious    ibuprofen (MOTRIN) 800 mg tablet Take one tablet by mouth every 8 hours as needed for Pain. Take with food.  Indications: pain    insulin glargine(+) (LANTUS U-100 INSULIN) 100 unit/mL subcutaneous solution Inject fifteen Units under the skin at bedtime daily. Indications: type 2 diabetes mellitus    lidocaine (LIDODERM) 5 % topical patch Apply one patch topically to affected area daily. Apply patch for 12 hours, then remove for 12 hours before repeating.  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 Indications: skin and skin structure infection    losartan (COZAAR) 100 mg tablet Take one tablet by mouth at bedtime daily.    magnesium oxide (MAGOX) 400 mg (241.3 mg magnesium) tablet Take one tablet by mouth twice daily.    medroxyPROGESTERone (DEPO-PROVERA) 150 mg/mL injection Inject 1 mL into the muscle every 90 days.    melatonin 5 mg chew Chew 3 Gummy by mouth at bedtime as needed.    metFORMIN (GLUCOPHAGE) 1,000 mg tablet Take one tablet by mouth twice daily with meals.    metoprolol tartrate 25 mg tablet Take one-half tablet by mouth twice daily.    montelukast (SINGULAIR) 10 mg tablet Take one tablet by mouth at bedtime daily.    omeprazole DR (PRILOSEC) 20 mg capsule Take one capsule by mouth daily before breakfast.    other medication Medication Name & Strength: [place] Probiotic Multi-Enzyme Digestive Formula Tablets    Dose(how many): 3 tablets    Frequency(how often): daily    other medication Medication Name & Strength: AZO Yeast Plus    Dose(how many): 1 tablet    Frequency(how often): three times daily as needed    oxymetazoline (AFRIN EXTRA MOISTURIZING) 0.05 % nasal spray Apply two sprays to each nostril as directed daily as needed (allergies). Max of 3 days.    OZEMPIC 1 mg/dose (4 mg/3 mL) injection PEN Inject one mg under the skin every 7 days.    PROAIR HFA 90 mcg/actuation inhaler Inhale two puffs by mouth into the lungs every 4 hours as needed.    prochlorperazine maleate (COMPAZINE) 10 mg tablet Take one tablet by mouth every 8 hours as needed for Other... (take with benadryl as needed for migraine headache).    riboflavin (vitamin B2) 400 mg tablet Take one tablet by mouth daily.    SUMAtriptan succinate (IMITREX) 100 mg tablet Take one tablet by mouth at onset of headache. May repeat after 2 hours if needed. Max of 200 mg in 24 hours.    tiZANidine (ZANAFLEX) 4 mg tablet Take one-half tablet by mouth daily as needed.    topiramate (TOPAMAX) 50 mg tablet Take 50mg  in the morning and 100mg  at night for total of 150mg  daily.    traMADoL (ULTRAM) 50 mg tablet Take one tablet by mouth every 8 hours as needed for Pain. Indications: pain       Allergies  Allergies   Allergen Reactions    Pcn [Penicillins] HIVES        Objective     Vital Signs This Visit  Vitals:    04/11/23 1451   BP: 127/79   BP Source: Arm, Right Upper   Pulse: 85   SpO2: 99%   O2 Device: None (Room air)   PainSc: Zero   Weight: 132.6 kg (292 lb 6.4  oz)   Height: 172.7 cm (5' 8)       Physical Exam  General Appearance: No acute distress. Fully alert and oriented. Obese  Skin: Warm. No ulcers or xanthomas.   HEENT: Grossly unremarkable. Lips and oral mucosa without pallor or cyanosis. Moist mucous membranes.   Neck Veins: Normal jugular venous pressure. Neck veins are not distended.  Carotid Arteries: Normal carotid upstroke bilaterally. No bruits.  Auscultation/Percussion: Normal respiratory effort. Lungs clear to auscultation bilaterally. No wheezes, rales, or rhonchi.    Cardiac Rhythm: Regular rhythm. Normal rate.  Cardiac Auscultation: Normal S1 & S2. No S3 or S4. No rub.  Murmurs: No cardiac murmurs.  Peripheral Circulation: Normal peripheral circulation.   Abdominal Aorta: No abdominal aortic bruit.  Extremities: Appropriately warm to touch. No lower extremity edema.  Abdominal Exam: Soft, non-tender. No masses, no organomegaly. Normal bowel sounds.  Neurologic Exam: Neurological assessment grossly intact.      Cardiovascular Health Factors  Vitals BP Readings from Last 3 Encounters:   04/11/23 127/79   04/07/23 131/74   03/24/23 (!) 128/110     Wt Readings from Last 3 Encounters:   04/11/23 132.6 kg (292 lb 6.4 oz)   04/05/23 133.8 kg (295 lb)   03/15/23 133.8 kg (295 lb)     BMI Readings from Last 3 Encounters:   04/11/23 44.46 kg/m?   04/05/23 44.85 kg/m?   03/15/23 44.85 kg/m?      Smoking Social History     Tobacco Use   Smoking Status Never   Smokeless Tobacco Never      Lipid Profile Cholesterol   Date Value Ref Range Status   03/15/2023 171 <200 MG/DL Final     HDL   Date Value Ref Range Status   03/15/2023 32 (L) >40 MG/DL Final     LDL   Date Value Ref Range Status   03/15/2023 131 (H) <100 mg/dL Final     Triglycerides   Date Value Ref Range Status   03/15/2023 181 (H) <150 MG/DL Final      Blood Sugar Hemoglobin A1C   Date Value Ref Range Status   01/31/2023 6.0 (H) 4.0 - 5.7 % Final     Comment:     The ADA recommends that most patients with type 1 and type 2 diabetes maintain   an A1c level <7%.       Glucose   Date Value Ref Range Status   04/07/2023 122 (H) 70 - 100 MG/DL Final   16/08/9603 97 70 - 100 MG/DL Final   54/07/8118 147 (H) 70 - 100 MG/DL Final     Glucose, POC   Date Value Ref Range Status   04/07/2023 128 (H) 70 - 100 MG/DL Final   82/95/6213 086 (H) 70 - 100 MG/DL Final   57/84/6962 952 (H) 70 - 100 MG/DL Final      10 Year ASCVD Risk The ASCVD Risk score (Arnett DK, et al., 2019) failed to calculate for the following reasons:    The 2019 ASCVD risk score is only valid for ages 4 to 9          Assessment and Plan        Samantha Valencia is a 27 y.o. who was seen for the following problems:    Atypical chest pain: ECG with nonspecific ST-T wave abnormalities.  Patient does have risk factors for coronary atherosclerosis and including diabetes, hypertension, and hyperlipidemia.  She is able to exercise and  her ECG is interpretable.  Will have her undergo an exercise ECG to evaluate for ischemia.  Previous echocardiograms revealed normal biventricular size and function with an LVEF of 61%.  No valvular abnormalities.      Diabetic foot ulcer on right lower extremity: Follows with wound care on suppressive antibiotics.    Lymphedema: Has referral to lymphedema clinic with through plastics.    Diabetes mellitus, type II, A1c 6.0: On insulin and Ozempic    Hypertension, blood pressure is well-controlled    Dyslipidemia, LDL 131: Recently started on atorvastatin    Palpitations: Likely benign PACs.  Will continue to monitor.    Thank you for allowing [place] to care for this patient. If there are any questions or concerns, please don't hesitate to contact us.     We will see the patient back in 12 months time, or sooner if needed.     Modena Jansky, DO  Staff Cardiologist  Department of Cardiovascular Medicine  [facility] via [place] or Pager #: [phone]    Total time spent on today's office visit was .  This includes over 50% face-to-face in person visit with patient as well as nonface-to-face time including review of the EMR, outside records, labs, radiologic studies, echocardiogram & other cardiovascular studies, formation of treatment plan, after visit summary, future disposition, and lastly on documentation.    This note was in part completed with Dragon, a Chemical engineer. Some grammatical errors may have occurred. If you have concerns,please contact my office for clarification.

## 2023-04-12 DIAGNOSIS — Z6841 Body Mass Index (BMI) 40.0 and over, adult: Secondary | ICD-10-CM

## 2023-04-17 ENCOUNTER — Encounter: Admit: 2023-04-17 | Discharge: 2023-04-17 | Payer: BC Managed Care – PPO | Primary: Family

## 2023-04-24 ENCOUNTER — Encounter: Admit: 2023-04-24 | Discharge: 2023-04-24 | Payer: BC Managed Care – PPO | Primary: Family

## 2023-04-24 LAB — COMPREHENSIVE METABOLIC PANEL
ALBUMIN: 4.1 g/dL (ref 3.5–5.0)
ALK PHOSPHATASE: 29 U/L — ABNORMAL LOW (ref 25–110)
ALT: 13 U/L (ref 7–56)
ANION GAP: 12 K/UL — ABNORMAL HIGH (ref 3–12)
AST: 11 U/L (ref 7–40)
BLD UREA NITROGEN: 14 mg/dL (ref 7–25)
CALCIUM: 9.5 mg/dL — ABNORMAL HIGH (ref 8.5–10.6)
CHLORIDE: 105 MMOL/L — ABNORMAL LOW (ref 98–110)
CO2: 21 MMOL/L (ref 21–30)
CREATININE: 0.9 mg/dL (ref 0.4–1.00)
EGFR: >60 mL/min (ref >60)
GLUCOSE,PANEL: 114 mg/dL — ABNORMAL HIGH (ref 70–100)
POTASSIUM: 3.5 MMOL/L — ABNORMAL LOW (ref 3.5–5.1)
SODIUM: 138 MMOL/L — ABNORMAL LOW (ref 137–147)
TOTAL BILIRUBIN: 0.6 mg/dL (ref 0.2–1.3)
TOTAL PROTEIN: 7.9 g/dL (ref 6.0–8.0)

## 2023-04-24 LAB — CBC AND DIFF
ABSOLUTE BASO COUNT: 0.1 K/UL (ref 0–0.20)
ABSOLUTE EOS COUNT: 0.4 K/UL — ABNORMAL HIGH (ref 0–0.45)
ABSOLUTE MONO COUNT: 0.9 K/UL — ABNORMAL HIGH (ref 0–0.80)
MDW (MONOCYTE DISTRIBUTION WIDTH): 22 — ABNORMAL HIGH (ref <20.7)
WBC COUNT: 12 K/UL — ABNORMAL HIGH (ref 4.5–11.0)

## 2023-04-24 LAB — POC LACTATE: LACTIC ACID POC: 1.1 MMOL/L (ref 0.5–2.0)

## 2023-04-24 NOTE — Telephone Encounter
Called patient requesting picture.She will have to ask her manager if she is able to come in tomorrow. She will let me know via MyChart.   Jerene Pitch, RN

## 2023-04-24 NOTE — Telephone Encounter
RVM from [facility] stating she is having symptoms and is concerned the cellulitis is coming back. She voiced her leg is red and pink again. She is feeling overall sluggish and denies fever but states she hasn't taken a temp. She is concerned and is asking if a different antibiotic is needed. I attempted to ask if she could be seen tomorrow at 3:30 if Dr.Poplin is ok with an add on but she denied saying if it doesn't get better she will come into the ER. Routing to Dr.Poplin to advise.   Jerene Pitch, RN

## 2023-04-25 ENCOUNTER — Emergency Department: Admit: 2023-04-25 | Discharge: 2023-04-24 | Payer: BC Managed Care – PPO

## 2023-04-25 ENCOUNTER — Encounter: Admit: 2023-04-25 | Discharge: 2023-04-25 | Payer: BC Managed Care – PPO | Primary: Family

## 2023-04-25 ENCOUNTER — Observation Stay: Admit: 2023-04-25 | Discharge: 2023-04-25 | Payer: BC Managed Care – PPO | Primary: Family

## 2023-04-25 MED ADMIN — OXYCODONE 5 MG PO TAB [10814]: 5 mg | ORAL | @ 18:00:00 | NDC 42858000110

## 2023-04-25 MED ADMIN — PANTOPRAZOLE 40 MG PO TBEC [80436]: 40 mg | ORAL | @ 13:00:00 | NDC 00904647461

## 2023-04-25 MED ADMIN — IOHEXOL 350 MG IODINE/ML IV SOLN [81210]: 100 mL | INTRAVENOUS | @ 19:00:00 | Stop: 2023-04-25 | NDC 00407141491

## 2023-04-25 MED ADMIN — CEFAZOLIN 3 GRAM IV SOLR [462610]: 3 g | INTRAVENOUS | @ 17:00:00 | Stop: 2023-04-25 | NDC 00143914001

## 2023-04-25 MED ADMIN — VANCOMYCIN 5 GRAM IV SOLR [8444]: 2500 mg | INTRAVENOUS | @ 08:00:00 | Stop: 2023-04-25 | NDC 71288002575

## 2023-04-25 MED ADMIN — METOPROLOL TARTRATE 25 MG PO TAB [37637]: 12.5 mg | ORAL | @ 13:00:00 | NDC 51079025501

## 2023-04-25 MED ADMIN — LIDOCAINE 5 % TP PTMD [80759]: 1 | TOPICAL | @ 13:00:00 | NDC 00591352511

## 2023-04-25 MED ADMIN — TRAMADOL 50 MG PO TAB [14632]: 50 mg | ORAL | @ 07:00:00 | Stop: 2023-04-25 | NDC 00904717961

## 2023-04-25 MED ADMIN — OXYCODONE 5 MG PO TAB [10814]: 5 mg | ORAL | @ 13:00:00 | NDC 42858000110

## 2023-04-25 MED ADMIN — ACETAMINOPHEN 325 MG PO TAB [101]: 650 mg | ORAL | @ 18:00:00 | NDC 00904677361

## 2023-04-25 MED ADMIN — TOPIRAMATE 25 MG PO TAB [78823]: 50 mg | ORAL | @ 13:00:00 | NDC 68382013814

## 2023-04-25 MED ADMIN — SODIUM CHLORIDE 0.9 % IJ SOLN [7319]: 50 mL | INTRAVENOUS | @ 19:00:00 | Stop: 2023-04-25 | NDC 00409488820

## 2023-04-25 MED ADMIN — ATORVASTATIN 10 MG PO TAB [77736]: 10 mg | ORAL | @ 13:00:00 | NDC 00904629061

## 2023-04-25 MED ADMIN — SODIUM CHLORIDE 0.9 % IV SOLP [27838]: 250 mL | INTRAVENOUS | @ 08:00:00 | Stop: 2023-04-25 | NDC 00338004902

## 2023-04-25 MED ADMIN — DIPHENHYDRAMINE HCL 25 MG PO CAP [2509]: 25 mg | ORAL | @ 22:00:00 | Stop: 2023-04-25 | NDC 00904723761

## 2023-04-25 MED ADMIN — HEPARIN, PORCINE (PF) 5,000 UNIT/0.5 ML IJ SYRG [95535]: 5000 [IU] | SUBCUTANEOUS | @ 19:00:00 | NDC 00409131611

## 2023-04-25 MED ADMIN — HYDROCHLOROTHIAZIDE 25 MG PO TAB [3720]: 25 mg | ORAL | @ 13:00:00 | NDC 23155000801

## 2023-04-25 MED ADMIN — ACETAMINOPHEN 325 MG PO TAB [101]: 650 mg | ORAL | @ 09:00:00 | NDC 00904677361

## 2023-04-25 MED ADMIN — SODIUM CHLORIDE 0.9 % IV SOLP [27838]: 2500 mg | INTRAVENOUS | @ 08:00:00 | Stop: 2023-04-25 | NDC 00338004903

## 2023-04-25 MED ADMIN — HYDROCODONE-ACETAMINOPHEN 5-325 MG PO TAB [34505]: 1 | ORAL | @ 11:00:00 | Stop: 2023-04-25 | NDC 60687039611

## 2023-04-25 NOTE — ED Notes
ED Initial Provider Note:    This patient was seen in the ED triage area to initiate and expedite the patients ED care when possible.    ED Chief Complaint:   Chief Complaint   Patient presents with    Generalized Joint/Body Pain     Ulcer to right foot  pt is diabetic       S: Samantha Valencia is a 27 y.o. female who presents to the Emergency Department for ulcer to right foot.  Patient is diabetic.  She has an ulcer to her foot.  She was here 3 weeks ago and treated with IV antibiotics and sent home with antibiotics.  She has been taking medicine as directed but noticed redness to her ankle and lower leg.  No fever or chills.  Patient is on Depo-Provera.    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    O: Brief Physical: Alert female sitting in chair in no acute distress tearful.  Right foot with quarter sized ulcer to plantar aspect.  Erythema to ankle and lower leg.    A/P: The patient was seen by me as an initial provider in triage. A brief history and physical was obtained. My exam is intended to be an initial medial screening exam. Initial orders have been placed by me. My working diagnosis is cellulitis right lower leg.    The patient is deemed appropriate for the main ED. The patient's care will be resumed by the ED provider care team once the patient is roomed in the ED. A more detailed / complete H&P will be documented by those providers.

## 2023-04-26 ENCOUNTER — Encounter: Admit: 2023-04-26 | Discharge: 2023-04-26 | Payer: BC Managed Care – PPO | Primary: Family

## 2023-04-26 MED ADMIN — ACETAMINOPHEN 325 MG PO TAB [101]: 650 mg | ORAL | @ 11:00:00 | Stop: 2023-04-26 | NDC 00904677361

## 2023-04-26 MED ADMIN — METOPROLOL TARTRATE 25 MG PO TAB [37637]: 12.5 mg | ORAL | @ 02:00:00 | NDC 51079025501

## 2023-04-26 MED ADMIN — MONTELUKAST 10 MG PO TAB [81627]: 10 mg | ORAL | @ 02:00:00 | NDC 00904680861

## 2023-04-26 MED ADMIN — LIDOCAINE 5 % TP PTMD [80759]: 1 | TOPICAL | @ 14:00:00 | Stop: 2023-04-26 | NDC 00591352511

## 2023-04-26 MED ADMIN — LINEZOLID 600 MG PO TAB [80084]: 600 mg | ORAL | @ 14:00:00 | Stop: 2023-04-26 | NDC 00904655304

## 2023-04-26 MED ADMIN — ACETAMINOPHEN 325 MG PO TAB [101]: 650 mg | ORAL | @ 02:00:00 | NDC 00904677361

## 2023-04-26 MED ADMIN — OXYCODONE 5 MG PO TAB [10814]: 5 mg | ORAL | @ 11:00:00 | Stop: 2023-04-26 | NDC 42858000110

## 2023-04-26 MED ADMIN — TOPIRAMATE 25 MG PO TAB [78823]: 50 mg | ORAL | @ 15:00:00 | Stop: 2023-04-26 | NDC 68382013814

## 2023-04-26 MED ADMIN — PANTOPRAZOLE 40 MG PO TBEC [80436]: 40 mg | ORAL | @ 14:00:00 | Stop: 2023-04-26 | NDC 00904647461

## 2023-04-26 MED ADMIN — LOSARTAN 50 MG PO TAB [76938]: 100 mg | ORAL | @ 02:00:00 | NDC 65862020290

## 2023-04-26 MED ADMIN — METOPROLOL TARTRATE 25 MG PO TAB [37637]: 12.5 mg | ORAL | @ 14:00:00 | Stop: 2023-04-26 | NDC 51079025501

## 2023-04-26 MED ADMIN — HEPARIN, PORCINE (PF) 5,000 UNIT/0.5 ML IJ SYRG [95535]: 5000 [IU] | SUBCUTANEOUS | @ 11:00:00 | Stop: 2023-04-26 | NDC 00409131611

## 2023-04-26 MED ADMIN — HYDROXYZINE HCL 25 MG PO TAB [3774]: 25 mg | ORAL | @ 02:00:00 | NDC 00904661761

## 2023-04-26 MED ADMIN — HYDROCHLOROTHIAZIDE 25 MG PO TAB [3720]: 25 mg | ORAL | @ 14:00:00 | Stop: 2023-04-26 | NDC 23155000801

## 2023-04-26 MED ADMIN — OXYCODONE 5 MG PO TAB [10814]: 5 mg | ORAL | @ 02:00:00 | NDC 42858000110

## 2023-04-26 MED ADMIN — INSULIN GLARGINE 100 UNIT/ML (3 ML) SC INJ PEN [163596]: 15 [IU] | SUBCUTANEOUS | @ 02:00:00 | NDC 00088221901

## 2023-04-26 MED ADMIN — TOPIRAMATE 100 MG PO TAB [82418]: 100 mg | ORAL | @ 02:00:00 | NDC 68382014014

## 2023-04-26 MED ADMIN — FLUOXETINE 10 MG PO CAP [10069]: 40 mg | ORAL | @ 02:00:00 | NDC 65862019201

## 2023-04-26 MED ADMIN — LINEZOLID 600 MG PO TAB [80084]: 600 mg | ORAL | @ 02:00:00 | NDC 00904655304

## 2023-04-26 MED FILL — OXYCODONE 5 MG PO TAB: 5 mg | ORAL | 2 days supply | Qty: 10 | Fill #1 | Status: CP

## 2023-04-26 MED FILL — METOPROLOL TARTRATE 25 MG PO TAB: 25 mg | ORAL | 30 days supply | Qty: 30 | Fill #1 | Status: CP

## 2023-04-27 ENCOUNTER — Encounter: Admit: 2023-04-27 | Discharge: 2023-04-27 | Payer: BC Managed Care – PPO | Primary: Family

## 2023-04-28 ENCOUNTER — Encounter: Admit: 2023-04-28 | Discharge: 2023-04-28 | Payer: BC Managed Care – PPO | Primary: Family

## 2023-05-01 ENCOUNTER — Encounter: Admit: 2023-05-01 | Discharge: 2023-05-01 | Payer: BC Managed Care – PPO | Primary: Family

## 2023-05-01 ENCOUNTER — Emergency Department: Admit: 2023-05-01 | Discharge: 2023-05-01 | Payer: BC Managed Care – PPO

## 2023-05-01 LAB — COMPREHENSIVE METABOLIC PANEL
ALBUMIN: 4.2 g/dL (ref 3.5–5.0)
ALK PHOSPHATASE: 36 U/L (ref 25–110)
ALT: 13 U/L (ref 7–56)
ANION GAP: 14 K/UL — ABNORMAL HIGH (ref 3–12)
AST: 12 U/L (ref 7–40)
BLD UREA NITROGEN: 16 mg/dL (ref 7–25)
CALCIUM: 9.6 mg/dL — ABNORMAL HIGH (ref 8.5–10.6)
CHLORIDE: 105 MMOL/L — ABNORMAL HIGH (ref <20.7)
CO2: 19 MMOL/L — ABNORMAL LOW (ref 21–30)
EGFR: >60 mL/min (ref >60)
GLUCOSE,PANEL: 90 mg/dL (ref 70–100)
POTASSIUM: 3.4 MMOL/L — ABNORMAL LOW (ref 3.5–5.1)
SODIUM: 138 MMOL/L (ref 137–147)
TOTAL BILIRUBIN: 0.5 mg/dL (ref 0.2–1.3)
TOTAL PROTEIN: 7.9 g/dL (ref 6.0–8.0)

## 2023-05-01 LAB — CBC AND DIFF
ABSOLUTE BASO COUNT: 0 K/UL (ref 0–0.20)
ABSOLUTE EOS COUNT: 0.4 K/UL — ABNORMAL HIGH (ref 0–0.45)
ABSOLUTE MONO COUNT: 0.6 K/UL (ref 0–0.80)
MDW (MONOCYTE DISTRIBUTION WIDTH): 17 (ref <20.7)
WBC COUNT: 11 K/UL — ABNORMAL HIGH (ref 4.5–11.0)

## 2023-05-01 LAB — POC LACTATE: LACTIC ACID POC: 1 MMOL/L (ref 0.5–2.0)

## 2023-05-01 LAB — HIGH SENSITIVITY TROPONIN I 0 HOUR: HIGH SENSITIVITY TROPONIN I 0 HOUR: 3 ng/L — ABNORMAL HIGH (ref <12)

## 2023-05-01 MED ORDER — OXYCODONE 5 MG PO TAB
5 mg | Freq: Once | ORAL | 0 refills | Status: AC
Start: 2023-05-01 — End: ?
  Administered 2023-05-02: 05:00:00 5 mg via ORAL

## 2023-05-01 MED ORDER — LACTATED RINGERS IV SOLP
500 mL | INTRAVENOUS | 0 refills | Status: AC
Start: 2023-05-01 — End: ?
  Administered 2023-05-02: 05:00:00 500 mL via INTRAVENOUS

## 2023-05-02 ENCOUNTER — Emergency Department: Admit: 2023-05-02 | Discharge: 2023-05-01 | Payer: BC Managed Care – PPO

## 2023-05-02 ENCOUNTER — Encounter: Admit: 2023-05-02 | Discharge: 2023-05-02 | Payer: BC Managed Care – PPO | Primary: Family

## 2023-05-02 ENCOUNTER — Emergency Department: Admit: 2023-05-02 | Discharge: 2023-05-02 | Payer: BC Managed Care – PPO

## 2023-05-02 MED ADMIN — ACETAMINOPHEN 325 MG PO TAB [101]: 650 mg | ORAL | @ 06:00:00 | Stop: 2023-05-02 | NDC 00904677361

## 2023-05-02 MED ADMIN — SODIUM CHLORIDE 0.9 % IV PGBK (MB+) [95161]: 2 g | INTRAVENOUS | @ 15:00:00 | Stop: 2023-05-02 | NDC 00338915930

## 2023-05-02 MED ADMIN — TOPIRAMATE 25 MG PO TAB [78823]: 50 mg | ORAL | @ 13:00:00 | NDC 68084034211

## 2023-05-02 MED ADMIN — SODIUM CHLORIDE 0.9 % IV SOLP [27838]: 2500 mg | INTRAVENOUS | @ 09:00:00 | Stop: 2023-05-02 | NDC 00338004903

## 2023-05-02 MED ADMIN — CHOLECALCIFEROL (VITAMIN D3) 25 MCG (1,000 UNIT) PO TAB [130074]: 2000 [IU] | ORAL | @ 13:00:00 | NDC 80681016900

## 2023-05-02 MED ADMIN — METOPROLOL TARTRATE 25 MG PO TAB [37637]: 12.5 mg | ORAL | @ 13:00:00 | NDC 62584026511

## 2023-05-02 MED ADMIN — METRONIDAZOLE 500 MG PO TAB [5016]: 500 mg | ORAL | @ 08:00:00 | Stop: 2023-05-02 | NDC 72578000801

## 2023-05-02 MED ADMIN — FUROSEMIDE 20 MG PO TAB [3294]: 20 mg | ORAL | @ 13:00:00 | NDC 00904717761

## 2023-05-02 MED ADMIN — OXYCODONE 5 MG PO TAB [10814]: 5 mg | ORAL | @ 13:00:00 | NDC 42858000110

## 2023-05-02 MED ADMIN — PIPERACILLIN-TAZOBACTAM 4.5 GRAM IV SOLR [80419]: 4.5 g | INTRAVENOUS | @ 07:00:00 | Stop: 2023-05-02 | NDC 60505615900

## 2023-05-02 MED ADMIN — PROCHLORPERAZINE MALEATE 10 MG PO TAB [6582]: 10 mg | ORAL | @ 12:00:00 | Stop: 2023-05-02 | NDC 50268068511

## 2023-05-02 MED ADMIN — OXYCODONE 5 MG PO TAB [10814]: 5 mg | ORAL | @ 18:00:00 | NDC 42858000110

## 2023-05-02 MED ADMIN — ACETAMINOPHEN 325 MG PO TAB [101]: 650 mg | ORAL | @ 15:00:00 | NDC 00904677361

## 2023-05-02 MED ADMIN — VANCOMYCIN 5 GRAM IV SOLR [8444]: 2500 mg | INTRAVENOUS | @ 09:00:00 | Stop: 2023-05-02 | NDC 71288002575

## 2023-05-02 MED ADMIN — CEFEPIME 2 GRAM IJ SOLR [78195]: 2 g | INTRAVENOUS | @ 08:00:00 | Stop: 2023-05-02 | NDC 60505614700

## 2023-05-02 MED ADMIN — HYDROCHLOROTHIAZIDE 25 MG PO TAB [3720]: 25 mg | ORAL | @ 13:00:00 | NDC 23155000801

## 2023-05-02 MED ADMIN — SODIUM CHLORIDE 0.9 % IV PGBK (MB+) [95161]: 4.5 g | INTRAVENOUS | @ 07:00:00 | Stop: 2023-05-02 | NDC 00338915930

## 2023-05-02 MED ADMIN — METRONIDAZOLE 500 MG PO TAB [5016]: 500 mg | ORAL | @ 13:00:00 | Stop: 2023-05-02 | NDC 72578000801

## 2023-05-02 MED ADMIN — HYDROXYZINE HCL 25 MG PO TAB [3774]: 25 mg | ORAL | @ 13:00:00 | NDC 00904661761

## 2023-05-02 MED ADMIN — CEFEPIME 2 GRAM IJ SOLR [78195]: 2 g | INTRAVENOUS | @ 15:00:00 | Stop: 2023-05-02 | NDC 60505614700

## 2023-05-02 MED ADMIN — ACETAMINOPHEN 325 MG PO TAB [101]: 650 mg | ORAL | @ 10:00:00 | Stop: 2023-05-02 | NDC 00904677361

## 2023-05-02 MED ADMIN — SODIUM CHLORIDE 0.9 % IV PGBK (MB+) [95161]: 2 g | INTRAVENOUS | @ 08:00:00 | Stop: 2023-05-02 | NDC 00338915930

## 2023-05-02 MED ADMIN — OXYCODONE 5 MG PO TAB [10814]: 5 mg | ORAL | @ 09:00:00 | NDC 00406055223

## 2023-05-02 MED ADMIN — ENOXAPARIN 40 MG/0.4 ML SC SYRG [85052]: 40 mg | SUBCUTANEOUS | @ 13:00:00 | NDC 00781324602

## 2023-05-02 MED ADMIN — FENTANYL CITRATE (PF) 50 MCG/ML IJ SOLN [3037]: 50 ug | INTRAVENOUS | @ 06:00:00 | Stop: 2023-05-02 | NDC 00409909412

## 2023-05-02 MED ADMIN — ACETAMINOPHEN 325 MG PO TAB [101]: 650 mg | ORAL | @ 09:00:00 | NDC 00904677361

## 2023-05-02 NOTE — ED Notes
ED Initial Provider Note:    This patient was seen in the ED triage area to initiate and expedite the patients ED care when possible.    ED Chief Complaint:   Chief Complaint   Patient presents with    Skin Ulcer     R foot ulcer, cellulitis R leg, recently hospitalized and sent home w/ ABX; pt states ulcer has gotten worse    Chest Pain     Palpitations x 1 hour, mid sternal CP x 1 hour, denies SOA, endorses nausea and weakness       S: Samantha Valencia is a 27 y.o. female who presents to the Emergency Department for right lower extremity cellulitis associated with a diabetic foot ulcer. Patient recently hospitalized for this and discharged on linezolid. Patient has about 2 doses left and started noticing increased redness and pain. Also endorses chest pain/palpitations that began about an hour ago. Denies SOB.     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 Speaking in full sentences  Chest: regular rate  MSK: no gross deformity; right lower extremity erythema and edema; unable to visualize the DFU secondary to confines of triage  Neuro: alert and oriented x4. Ambulatory with steady gait.       A/P: The patient was seen by me as an initial provider in triage. A brief history and physical was obtained. My exam is intended to be an initial medial screening exam. Initial orders have been placed by me. My working diagnosis is cellulitis, sepsis, DFU, ACS, angina, arrhythmia.    The patient is deemed appropriate for the main ED. The patient's care will be resumed by the ED provider care team once the patient is roomed in the ED. A more detailed / complete H&P will be documented by those providers.

## 2023-05-03 ENCOUNTER — Encounter: Admit: 2023-05-03 | Discharge: 2023-05-03 | Payer: BC Managed Care – PPO | Primary: Family

## 2023-05-03 MED ADMIN — ENOXAPARIN 40 MG/0.4 ML SC SYRG [85052]: 40 mg | SUBCUTANEOUS | @ 13:00:00 | Stop: 2023-05-03 | NDC 00781324602

## 2023-05-03 MED ADMIN — ATORVASTATIN 10 MG PO TAB [77736]: 10 mg | ORAL | @ 13:00:00 | Stop: 2023-05-03 | NDC 00904629061

## 2023-05-03 MED ADMIN — CHOLECALCIFEROL (VITAMIN D3) 25 MCG (1,000 UNIT) PO TAB [130074]: 2000 [IU] | ORAL | @ 13:00:00 | Stop: 2023-05-03 | NDC 80681016900

## 2023-05-03 MED ADMIN — INSULIN GLARGINE 100 UNIT/ML (3 ML) SC INJ PEN [163596]: 15 [IU] | SUBCUTANEOUS | @ 02:00:00 | NDC 00088221905

## 2023-05-03 MED ADMIN — OXYCODONE 5 MG PO TAB [10814]: 5 mg | ORAL | @ 13:00:00 | Stop: 2023-05-03 | NDC 00406055223

## 2023-05-03 MED ADMIN — FLUOXETINE 20 MG PO CAP [10070]: 40 mg | ORAL | @ 02:00:00 | NDC 00904578561

## 2023-05-03 MED ADMIN — GABAPENTIN 100 MG PO CAP [18309]: 100 mg | ORAL | @ 02:00:00 | NDC 00904666561

## 2023-05-03 MED ADMIN — METOPROLOL TARTRATE 25 MG PO TAB [37637]: 12.5 mg | ORAL | @ 02:00:00 | NDC 62584026511

## 2023-05-03 MED ADMIN — HYDROCHLOROTHIAZIDE 25 MG PO TAB [3720]: 25 mg | ORAL | @ 13:00:00 | Stop: 2023-05-03 | NDC 23155000801

## 2023-05-03 MED ADMIN — METOPROLOL TARTRATE 25 MG PO TAB [37637]: 12.5 mg | ORAL | @ 13:00:00 | Stop: 2023-05-03 | NDC 62584026511

## 2023-05-03 MED ADMIN — POTASSIUM CHLORIDE 20 MEQ PO TBTQ [35943]: 40 meq | ORAL | @ 14:00:00 | Stop: 2023-05-03 | NDC 00832532510

## 2023-05-03 MED ADMIN — TOPIRAMATE 25 MG PO TAB [78823]: 50 mg | ORAL | @ 13:00:00 | Stop: 2023-05-03 | NDC 68084034211

## 2023-05-03 MED ADMIN — TOPIRAMATE 100 MG PO TAB [82418]: 100 mg | ORAL | @ 02:00:00 | NDC 68382014014

## 2023-05-03 MED ADMIN — ACETAMINOPHEN 325 MG PO TAB [101]: 650 mg | ORAL | @ 13:00:00 | Stop: 2023-05-03 | NDC 00904677361

## 2023-05-03 MED ADMIN — FUROSEMIDE 20 MG PO TAB [3294]: 20 mg | ORAL | @ 13:00:00 | Stop: 2023-05-03 | NDC 00904717761

## 2023-05-03 MED ADMIN — HYDROXYZINE HCL 25 MG PO TAB [3774]: 25 mg | ORAL | @ 13:00:00 | Stop: 2023-05-03 | NDC 00904661761

## 2023-05-03 MED ADMIN — AMOXICILLIN-POT CLAVULANATE 875-125 MG PO TAB [33228]: 875 mg | ORAL | NDC 00093227534

## 2023-05-03 MED ADMIN — LIDOCAINE 5 % TP PTMD [80759]: 1 | TOPICAL | @ 13:00:00 | Stop: 2023-05-03 | NDC 00591352511

## 2023-05-03 MED ADMIN — FERROUS SULFATE 325 MG (65 MG IRON) PO TAB [3074]: 325 mg | ORAL | @ 13:00:00 | Stop: 2023-05-03 | NDC 00904759161

## 2023-05-03 MED ADMIN — LOSARTAN 50 MG PO TAB [76938]: 100 mg | ORAL | @ 02:00:00 | NDC 68084034711

## 2023-05-03 MED ADMIN — AMOXICILLIN-POT CLAVULANATE 875-125 MG PO TAB [33228]: 875 mg | ORAL | @ 13:00:00 | Stop: 2023-05-03 | NDC 00093227534

## 2023-05-05 ENCOUNTER — Encounter: Admit: 2023-05-05 | Discharge: 2023-05-05 | Payer: BC Managed Care – PPO | Primary: Family

## 2023-05-05 NOTE — Telephone Encounter
05/05/23@ 1301pm- Call placed to pt, left VM requesting return call back to nurse line to discuss the below staff message received:           ===View-only below this line===  ----- Message -----  From: Larinda Buttery, RN  Sent: 05/05/2023  12:55 PM CDT  To: Quay Burow; Cvm Nurse Gen Card Team Gold  Subject: FW: tml only for Monday                              05/05/2023 12:50 PM This pt had another admission, Rt diabetic foot ulcer/RLE cellulitis, discharged Wed.   Lymphedema,   Perhaps a good idea to reschedule?    She sees wound care on July 10th and plastics the 18th  Thank you!  Angie   [address] Procedures  ----- Message -----  From: Quay Burow  Sent: 05/03/2023   8:43 AM CDT  To: Larinda Buttery, RN; Tia Masker, RN  Subject: tml only for Monday                              Hello,    Pt is still I/P at [facility] with Right diabetic foot ulcer. Will you pls check on Friday and probably should be rescheduled? Maybe switch to [facility]? She was having chest pain today, ordered ECG:  IMPRESSION  Normal sinus rhythm with sinus arrhythmia  Nonspecific T wave abnormality  Abnormal ECG  When compared with ECG of 01-May-2023 20:35,  Nonspecific T wave abnormality no longer evident in Lateral leads  QT has shortened

## 2023-05-08 ENCOUNTER — Encounter: Admit: 2023-05-08 | Discharge: 2023-05-08 | Payer: BC Managed Care – PPO | Primary: Family

## 2023-05-08 ENCOUNTER — Ambulatory Visit: Admit: 2023-05-08 | Discharge: 2023-05-08 | Payer: BC Managed Care – PPO | Primary: Family

## 2023-05-08 DIAGNOSIS — R079 Chest pain, unspecified: Secondary | ICD-10-CM

## 2023-05-09 ENCOUNTER — Ambulatory Visit: Admit: 2023-05-09 | Discharge: 2023-05-10 | Payer: BC Managed Care – PPO | Primary: Family

## 2023-05-09 ENCOUNTER — Encounter: Admit: 2023-05-09 | Discharge: 2023-05-09 | Payer: BC Managed Care – PPO | Primary: Family

## 2023-05-09 NOTE — Patient Instructions
Patient Instruction Sheet for Allergy Skin Testing    Skin Test: Skin tests are methods of testing for allergic antibodies. A test consists of introducing small amounts of the suspected substance, or allergen, into the skin and noting the development of a positive reaction (which consists of a welt or hive with surrounding redness). The results are read at 15 to 20 minutes after the application of the allergen. The skin test methods are:    Prick Method: The skin is pricked with a very short needle coated with a drop of allergen (ex ragweed pollen).    Intradermal Method: This method consists of injecting small amounts of an allergen into the superficial layers of the skin.    Interpreting the clinical significance of skin tests requires skillful correlation of the test results with the patient?s clinical history. Positive tests indicate the presence of allergic antibodies and are not necessarily correlated with clinical symptoms.    You may be tested to important (location) airborne allergens and possibly some foods. These may include trees, grasses, weeds, molds, dust mites, animal danders, and some foods.  The skin testing generally takes 45 minutes. Prick (also known as percutaneous) tests are usually performed on your back but may also be performed on your arms. Intradermal skin tests may be performed if the prick skin tests are negative and are performed on your arms. If  you have a specific allergic sensitivity to one of the allergens, a red, raised, itchy bump (caused by histamine release into the skin) will appear on your skin within 15 to 20 minutes. These positive reactions will gradually disappear over a period of 30 to 60 minutes and, typically, no treatment is necessary for this itchiness. Occasionally, local swelling at a test site will begin 4 to 8 hours after the skin tests are applied, particularly at sites of intradermal testing. These reactions are not serious, do not indicate a meaningful allergy, and will disappear over the next couple days or so. They should be reported to your physician at your next visit. You may be scheduled for skin testing to antibiotics, local anesthetics, venoms, or other biological agents. The same guidelines apply.      If you have any questions whether or not you are using an antihistamine, you may ask the nurse or the doctor. In some instances a longer period of time off these medications may be necessary.  If your condition requires continuous administration of any of the above medications, or if you have a question about a certain medication, please notify us so we may discuss this with you and determine whether alternative tests are indicated.     Skin testing will be administered at this medical facility with a medical physician present since occasional reactions may require immediate therapy. These reactions may consist of any or all of the following symptoms: itchy eyes, nose, or throat; nasal congestion; runny nose; tightness in the throat or chest; increased wheezing; lightheadedness; faintness; nausea and vomiting; hives; generalized itching; and shock, the latter under extreme circumstances.     Please let the physician and nurse know if you are:  Pregnant as allergy testing may be post-poned until after pregnancy.  Taking beta blockers, medications that may make the treatment of the reaction to skin testing more difficult.    Please note that these reactions rarely occur but in the event a reaction would occur, the  staff is fully trained and emergency equipment is available.  After skin testing, you will consult with your   physician, who will make further recommendations regarding your treatment    Please do not cancel your appointment since the time set aside for your skin test is exclusively yours. If for any reason you need to change your skin test appointment, please give us at least 48 hours notice, due to the length of time scheduled for skin testing, a last minute change results in a loss of valuable time that another patient might have utilized.    SKIN TESTING DRUG GUIDELINES    DIAGNOSTIC SKIN TESTS CANNOT BE DONE IF PATIENTS ARE  TAKING THE FOLLOWING MEDICATIONS    You must consult with referring physician or allergist prior to stopping any medications    Medications to Withhold Length of Time to Withhold   Topical anti-inflammatory or steroid (creams, lotions, gels, ointments, or solutions)   Do not apply the morning of test   Long-lasting antihistamines   Cetirizine (Zyrtec), fexofenadine (Allegra), desloratadine (Clarinex), levocetirizine (Xyzal), loratadine (Claritin), hydroxyzine (Atarax/Vistaril), cyproheptadine (Periactin), pyrilamine (Midol)    Withhold 7 days prior to testing   Other antihistamines  Actifed (chlorpheneramine), Dimetapp (brompheniramine), diphenhydramine (such as Benadryl, Unisom, Tylenol PM, Excedrin PM, Zzzquil), and Tavist (clemastine)   Withhold 5 days prior to skin testing   Nasal and eye medications  Patanase/Pataday/Patanol (olopatadine), Astepro/Optivar/Astelin/Dymista (azelastine)   Withhold 2 days prior to skin testing   Biologics  Xolair (omalizumab)   Withhold for 6 months prior to skin testing   Miscellaneous  Zantac (ranitidine), Pepcid (famotidine), and Tagamet (cimetidine)   Withhold for 1 day prior to skin testing   Psychiatric Medications  Amitriptyline (Elavil), clomipramine (Anafranil), desipramine (Norpramin), doxepin (Sinequan), imipramine (Tofranil), nortriptyline (Pamelor), protriptyline (Vivactil), and trimipramine (Surmontil) have antihistaminic activity.     Withhold for 2 weeks prior to skin testing   Nausea Medications  Promethazine  Dramamine (dimenhydrinate)   Withhold for 5 days prior to skin testing   Sleep aids  Trazodone, doxylamine (such as Nyquil)   Withhold for 3 days prior to skin testing   Muscle Relaxants  Flexeril (cyclobenzaprine)   Withhold 2 weeks prior to skin testing     If there are any questions whether or not you are using an antihistamine, you may ask your referring doctor or one of our allergists. In some instances a longer period of time off these medications may be necessary.  If the condition requires continuous administration of any of the above medications, or if you have a question about a certain medication, please notify us so we may discuss this and determine whether alternative tests are indicated.     THE FOLLOWING MEDICATIONS CAN BE TAKEN PRIOR TO SKIN TESTING  Nasal Steroids  Flonase/Veramyst (fluticasone), Rhinocort (budesonide), Nasonex (mometasone), Nasacort (triamcinolone), Omnaris/Zetonna (ciclesonide), QNASL (beclomethasone), and Nasarel (flunisolide) Asthma Inhalers  Inhaled steroids  Albuterol/Atrovent rescue inhalers  Albuterol/Atrovent nebulizers   Leukotriene Receptor Antagonists  Singulair (montelukast)  Accolate (zafirlukast)  Zyflo (zileuton)  *Should be avoided prior to challenges Theophylline  Theo-Dur,T-Phyl, Uniphyl, Theo-24    Prednisone  *Should be avoided prior to challenges

## 2023-05-10 ENCOUNTER — Ambulatory Visit: Admit: 2023-05-10 | Discharge: 2023-05-11 | Payer: BC Managed Care – PPO | Primary: Family

## 2023-05-10 ENCOUNTER — Encounter: Admit: 2023-05-10 | Discharge: 2023-05-10 | Payer: BC Managed Care – PPO | Primary: Family

## 2023-05-10 DIAGNOSIS — T50905A Adverse effect of unspecified drugs, medicaments and biological substances, initial encounter: Secondary | ICD-10-CM

## 2023-05-10 DIAGNOSIS — B999 Unspecified infectious disease: Secondary | ICD-10-CM

## 2023-05-10 DIAGNOSIS — E11621 Type 2 diabetes mellitus with foot ulcer: Secondary | ICD-10-CM

## 2023-05-10 DIAGNOSIS — R079 Chest pain, unspecified: Secondary | ICD-10-CM

## 2023-05-10 DIAGNOSIS — F419 Anxiety disorder, unspecified: Secondary | ICD-10-CM

## 2023-05-10 DIAGNOSIS — I10 Essential (primary) hypertension: Secondary | ICD-10-CM

## 2023-05-10 DIAGNOSIS — I890 Lymphedema, not elsewhere classified: Secondary | ICD-10-CM

## 2023-05-10 DIAGNOSIS — Z8742 Personal history of other diseases of the female genital tract: Secondary | ICD-10-CM

## 2023-05-10 DIAGNOSIS — E119 Type 2 diabetes mellitus without complications: Secondary | ICD-10-CM

## 2023-05-10 DIAGNOSIS — R42 Dizziness and giddiness: Secondary | ICD-10-CM

## 2023-05-10 DIAGNOSIS — F32A Depression: Secondary | ICD-10-CM

## 2023-05-10 DIAGNOSIS — D539 Nutritional anemia, unspecified: Secondary | ICD-10-CM

## 2023-05-10 DIAGNOSIS — E7800 Pure hypercholesterolemia, unspecified: Secondary | ICD-10-CM

## 2023-05-10 DIAGNOSIS — R000 Tachycardia, unspecified: Secondary | ICD-10-CM

## 2023-05-10 MED ORDER — MISCELLANEOUS MEDICAL SUPPLY MISC MISC
0 refills | 1.00000 days | Status: AC
Start: 2023-05-10 — End: ?

## 2023-05-10 NOTE — Patient Instructions
-   Aquacel Ag dressing, cover with ABD, secure with hypafix, change daily  - Keep clean and dry  - Monitor and report any signs of infection  - Continue antibiotics per ID  - Post-op shoe for offloading  - Compression daily  - Elevate feet higher than level of heart at least 20 minutes twice a day  - Maintain adequate glucose levels  - Protein in diet  - Follow-up in 2 weeks

## 2023-05-10 NOTE — Progress Notes
Name:Samantha Jasper Vangelder           MRN: [id number]                 DOB:[birth year]          Age: 27 y.o.  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    family history includes Bipolar Disorder in her mother; Cancer in her maternal grandmother; Depression in her mother; Diabetes in her maternal grandfather; Schizophrenia in her father.    Social History     Socioeconomic History    Marital status: Single   Tobacco Use    Smoking status: Never    Smokeless tobacco: Never   Vaping Use    Vaping status: Never Used   Substance and Sexual Activity    Alcohol use: Never    Drug use: Never    Sexual activity: Yes     Partners: Male     Birth control/protection: Injection       Vaping/E-liquid Use    Vaping Use Never User                   History of Present Illness    Samantha Valencia is a 27 y.o. female. Pt with a history of diabetes presents with wound on bottom of right foot. Pt had been in the hospital for recurrent right lower extremity cellulitis. On initial presentation, wound had thick callus in place but pt stated that she could feel pain under callus. Callus debrided in clinic revealing a fissure present. No signs of infection noted in clinic at that time. Pt was wearing an offloading boot for a majority of the day. Pt with a CGM and last A1C on 01/31/23 was 6.0%. Pt does not smoke. Pt eats a fair amount of protein in her diet. Was regularly wearing compression. Had MRI on 02/24/23 which showed no signs of osteomyelitis. Pt was in hospital in mid-May for cellulitis. Readmitted on 04/05/23 for cellulitis again in her right leg. Pt discharged but noted to have cellulitis again in right leg so admitted to hospital on 04/25/23. Receiving IV antibiotics with plan to transition back to oral medications. Imaging negative. Followed by ID. Continued hydrofera blue and pt followed up in wound clinic. Noted to have some increased drainage but no signs of infection. Stated that offloading boot had worn out.    Review of Systems   Skin:  Positive for wound.      Objective:          acetaminophen (TYLENOL) 325 mg tablet Take two tablets by mouth every 4 hours as needed. Indications: pain    atorvastatin (LIPITOR) 10 mg tablet Take one tablet by mouth daily.    cholecalciferol (vitamin D3) (VITAMIN D3  PO) Take 2 Gummy by mouth daily. 2 gummies = 2000 IU    ferrous sulfate (FEOSOL) 325 mg (65 mg iron) tablet Take one tablet by mouth three times weekly. Take on an empty stomach at least 1 hour before or 2 hours after food.  Indications: anemia from inadequate iron    FLORAJEN DIGESTION 15 billion cell cap Take 1 capsule by mouth every morning.    fluoxetine (PROZAC) 40 mg capsule Take one capsule by mouth at bedtime daily.    furosemide (LASIX) 20 mg tablet Take one tablet by mouth every morning. Indications: visible water retention    gabapentin (NEURONTIN) 100 mg capsule Take one capsule by mouth at bedtime daily.    hydroCHLOROthiazide (HYDRODIURIL) 25 mg tablet Take one tablet by mouth every morning.    hydrOXYzine HCL (ATARAX) 25 mg tablet Take one tablet by mouth every morning. Indications: anxious    ibuprofen (MOTRIN) 800 mg tablet Take one tablet by mouth every 8 hours as needed for Pain. Take with food.  Indications: pain    insulin glargine(+) (LANTUS U-100 INSULIN) 100 unit/mL subcutaneous solution Inject fifteen Units under the skin at bedtime daily. Indications: type 2 diabetes mellitus    lidocaine (LIDODERM) 5 % topical patch Apply one patch topically to affected area daily. Apply patch for 12 hours, then remove for 12 hours before repeating.  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    magnesium oxide (MAGOX) 400 mg (241.3 mg magnesium) tablet Take one tablet by mouth twice daily.    medroxyPROGESTERone (DEPO-PROVERA) 150 mg/mL injection Inject 1 mL into the muscle every 90 days.    melatonin 5 mg chew Chew 3 Gummy by mouth at bedtime as needed.    metFORMIN (GLUCOPHAGE) 1,000 mg tablet Take one tablet by mouth twice daily with meals.    metoprolol tartrate 25 mg tablet Take one-half tablet by mouth twice daily for 30 days. Indications: high blood pressure    montelukast (SINGULAIR) 10 mg tablet Take one tablet by mouth at bedtime daily.    other medication Medication Name & Strength: AZO Yeast Plus    Dose(how many): 1 tablet    Frequency(how often): three times daily as needed    oxyCODONE (ROXICODONE) 5 mg tablet Take one tablet by mouth every 4 hours as needed. Indications: pain    OZEMPIC 2 mg/dose (8 mg/3 mL) injection PEN Inject two mg under the skin every 7 days. Sundays    PROAIR HFA 90 mcg/actuation inhaler Inhale two puffs by mouth into the lungs every 4 hours as needed.    prochlorperazine maleate (COMPAZINE) 10 mg tablet Take one tablet by mouth every 8 hours as needed for Other... (take with benadryl as needed for migraine headache).    riboflavin (vitamin B2) 400 mg tablet Take one tablet by mouth daily.    SUMAtriptan succinate (IMITREX) 100 mg tablet Take one tablet by mouth at onset of headache. May repeat after 2 hours if needed. Max of 200 mg in 24 hours.  Indications: a migraine headache, DO NOT TAKE THIS MEDICINE WHILE YOU ARE ON LINEZOLID    tiZANidine (ZANAFLEX) 4 mg tablet Take one-half tablet by mouth daily as needed.    topiramate (TOPAMAX) 50 mg tablet Take 50mg  in the morning and 100mg  at night for total of 150mg  daily.     Vitals:    05/10/23 1422   BP: (!) 143/74 Pulse:  80   Temp: 36.7 ?C (98 ?F)   SpO2: 100%   PainSc: Three     There is no height or weight on file to calculate BMI.   Physical Exam  Constitutional:       Appearance: Normal appearance.   Eyes:      Conjunctiva/sclera: Conjunctivae normal.      Pupils: Pupils are equal, round, and reactive to light.   Cardiovascular:      Rate and Rhythm: Normal rate.   Pulmonary:      Effort: Pulmonary effort is normal.   Musculoskeletal:         General: Normal range of motion.      Cervical back: Normal range of motion.   Skin:     General: Skin is warm and dry.   Neurological:      Mental Status: She is alert. Mental status is at baseline.   Psychiatric:         Mood and Affect: Mood normal.         Thought Content: Thought content normal.           Wounds Pressure injury Right;Plantar (Active)   03/07/23 0935   Wound Type: Pressure injury   Orientation: Right;Plantar   Location:    Wound Location Comments:    Initial Wound Site Closure:    Initial Dressing Placed:    Initial Cycle:    Initial Suction Setting (mmHg):    Pressure Injury Stages:    Pressure Injury Present Within 24 Hours of Hospital Admission:    If This Pressure Injury Is Suspected to Be Device Related, Please Select the Device::    Is the Wound Open or Closed:    Image   05/10/23 1400   Wound Assessment Moist;Red 05/10/23 1400   Peri-wound Assessment Callous;Macerated 05/10/23 1400   Wound Drainage Amount Moderate 05/10/23 1400   Wound Drainage Description Serosanguineous 05/10/23 1400   Wound Dressing Status Intact 05/10/23 1400   Wound Care Dressing changed or new application 05/10/23 1400   Wound Dressing and/or Treatment Hydrofera blue ready;Hypafix tape 05/10/23 1400   Wound Status (Wound Team Only) Being Treated 05/10/23 1400   Wound Length (cm) (Wound Team Only) 0.5 cm 05/10/23 1400   Wound Width (cm) (Wound Team Only) 0.4 cm 05/10/23 1400   Wound Depth (cm) (Wound Team Only) 0.3 cm 05/10/23 1400   Wound Surface Area (cm^2) (Wound Team Only) 0.2 cm^2 05/10/23 1400   Wound Volume (cm^3) (Wound Team Only) 0.06 cm^3 05/10/23 1400   Wound Healing % (Wound Team Only) -150 05/10/23 1400   Undermining in CM (Wound Team Only) 0 cm 05/10/23 1400   Tunneling in CM (Wound Team Only) 0 cm 05/10/23 1400   Number of days: 64                       ALL wounds debrided using: sharp     Assessment and Plan:  Diabetic foot ulcer, right plantar foot, fat layer exposed  - Aquacel Ag dressing, cover with ABD, secure with hypafix, change daily  - Keep clean and dry  - Monitor and report any signs of infection  - Continue antibiotics per ID  - Post-op shoe for offloading, order for replacement offloading boot  - Compression daily  - Elevate feet higher than level of heart at least 20 minutes twice a day  - Maintain adequate glucose levels  - Protein in diet  - Follow-up in 2 weeks  Total Time Today was 20 minutes in the following activities: Preparing to see the patient, Performing a medically appropriate examination and/or evaluation, Counseling and educating the patient/family/caregiver, and Documenting clinical information in the electronic or other health record

## 2023-05-11 ENCOUNTER — Encounter: Admit: 2023-05-11 | Discharge: 2023-05-11 | Payer: BC Managed Care – PPO | Primary: Family

## 2023-05-11 DIAGNOSIS — L97412 Non-pressure chronic ulcer of right heel and midfoot with fat layer exposed: Secondary | ICD-10-CM

## 2023-05-11 NOTE — Telephone Encounter
Pt called and left a msg asking for a couple items. She is needing letter for work about testing this week. She is also wanting results from stress test on 7/9. Attempted to call back, but no ability to leave msg. Letter sent to pt with work excuse and also sent pt [facility] about stress test not finalized.

## 2023-05-12 ENCOUNTER — Encounter: Admit: 2023-05-12 | Discharge: 2023-05-12 | Payer: BC Managed Care – PPO | Primary: Family

## 2023-05-12 NOTE — Telephone Encounter
Spoke with patient and scheduled her for PCN challenge on 07/19 at 12:40 pm. Reminded pt to hold antihistamines for 7 days before challenge and metoprolol one day before challenge. Pt voiced understanding.

## 2023-05-14 ENCOUNTER — Encounter: Admit: 2023-05-14 | Discharge: 2023-05-14 | Payer: BC Managed Care – PPO | Primary: Family

## 2023-05-14 MED ORDER — TOPIRAMATE 50 MG PO TAB
ORAL_TABLET | 0 refills
Start: 2023-05-14 — End: ?

## 2023-05-16 ENCOUNTER — Encounter: Admit: 2023-05-16 | Discharge: 2023-05-16 | Payer: BC Managed Care – PPO | Primary: Family

## 2023-05-18 ENCOUNTER — Encounter: Admit: 2023-05-18 | Discharge: 2023-05-18 | Payer: BC Managed Care – PPO | Primary: Family

## 2023-05-18 ENCOUNTER — Ambulatory Visit: Admit: 2023-05-18 | Discharge: 2023-05-19 | Payer: BC Managed Care – PPO | Primary: Family

## 2023-05-18 DIAGNOSIS — I890 Lymphedema, not elsewhere classified: Secondary | ICD-10-CM

## 2023-05-18 DIAGNOSIS — D539 Nutritional anemia, unspecified: Secondary | ICD-10-CM

## 2023-05-18 DIAGNOSIS — F32A Depression: Secondary | ICD-10-CM

## 2023-05-18 DIAGNOSIS — T50905A Adverse effect of unspecified drugs, medicaments and biological substances, initial encounter: Secondary | ICD-10-CM

## 2023-05-18 DIAGNOSIS — I10 Essential (primary) hypertension: Secondary | ICD-10-CM

## 2023-05-18 DIAGNOSIS — R079 Chest pain, unspecified: Secondary | ICD-10-CM

## 2023-05-18 DIAGNOSIS — R609 Edema, unspecified: Secondary | ICD-10-CM

## 2023-05-18 DIAGNOSIS — R000 Tachycardia, unspecified: Secondary | ICD-10-CM

## 2023-05-18 DIAGNOSIS — E11621 Type 2 diabetes mellitus with foot ulcer: Secondary | ICD-10-CM

## 2023-05-18 DIAGNOSIS — E119 Type 2 diabetes mellitus without complications: Secondary | ICD-10-CM

## 2023-05-18 DIAGNOSIS — B999 Unspecified infectious disease: Secondary | ICD-10-CM

## 2023-05-18 DIAGNOSIS — Z8742 Personal history of other diseases of the female genital tract: Secondary | ICD-10-CM

## 2023-05-18 DIAGNOSIS — L03115 Cellulitis of right lower limb: Secondary | ICD-10-CM

## 2023-05-18 DIAGNOSIS — R42 Dizziness and giddiness: Secondary | ICD-10-CM

## 2023-05-18 DIAGNOSIS — F419 Anxiety disorder, unspecified: Secondary | ICD-10-CM

## 2023-05-18 DIAGNOSIS — E7800 Pure hypercholesterolemia, unspecified: Secondary | ICD-10-CM

## 2023-05-18 NOTE — Progress Notes
Date of Service: 05/18/2023    Subjective:             Samantha Valencia is a 27 y.o. female.    History of Present Illness  This is a 27 yo F referred by Dr. Adrian Saran for evaluation of lymphedema after recent hospital admission.    Her PMH includes DM II, lymphedema, R DFU, HLD, depression, anxiety. She has had multiple recent hospitalizations for R DFU with recurrent RLE cellulitis, most recently admitted to [facility] on 05/01/23. ID had placed her on suppressive PO Augmentin 875 BID but stopped due to chest pain, following up with allergy/immunology for skin testing and pcn challenge scheduled tomorrow.  Following up with ID ([facility]) as outpatient.   Had MRI on 03/14/23 which showed no signs of osteomyelitis.   Has seen Dr. Egbert Garibaldi recently in wound clinic, treating with Aquacel and offloading boot ordered.    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    Metformin, Ozempic  Has lost about 12 lbs in last 2 weeks    She has seen Dr. Nash Shearer in [facility]   Echo with EF 61% and no diastolic dysfunction  Exercise stress test pending after ECG done in hospital demonstrated T wave abnormalities and nonspecific ST wave changes  She does report ongoing SOB with rest and exertion    CT pelvis with contrast 04/25/23  IMPRESSION   1. No right inguinal/thigh mass or fluid collection to correlate with   lesion seen on recent ultrasound.      2.  Upper limits of normal sized right inguinal lymph nodes, likely   reactive.     US Doppler Venous RLE 04/25/23  1.  No deep vein thrombus in the right lower extremity.   2.  Right inguinal lymphadenopathy. This is likely reactive and/or   suppurative adenopathy secondary to patient's known cellulitis and right   foot wound, however lymphoproliferative or metastatic disease could appear   similar.   3. Ill-defined hypoechoic mass in the right groin/upper medial leg is   indeterminate and may represent an additional suppurative lymph node or   abscess. Fluid sampling and/or CT of the right lower extremity with   contrast could be obtained for further evaluation.     Echo 03/15/23   The left ventricular size is normal. The volume measurements of the left ventricle are mildly increased. The left ventricular wall thickness is normal. Normal geometry. The left ventricular systolic function is normal. The ejection fraction by Simpson's biplane method is 61%. There are no segmental wall motion abnormalities. Normal left ventricular diastolic function. Normal left atrial pressure.    The right ventricular size is normal. The right ventricular systolic function is normal. The pulmonary artery pressure could not be estimated due to inadequate tricuspid regurgitation signal.  Biatrial findings are grossly normal.    No significant valvular abnormalities identified.    The aortic root and ascending aorta are normal in size.    No pericardial effusion.      Has had RLE arterial duplex but no ABI's                   Objective:         acetaminophen (TYLENOL) 325 mg tablet Take two tablets by mouth every 4 hours as needed. Indications: pain    atorvastatin (LIPITOR) 10 mg tablet Take one tablet by mouth daily.    cholecalciferol (vitamin D3) (VITAMIN D3 PO) Take 2 Gummy by mouth daily. 2 gummies = 2000 IU    ferrous sulfate (FEOSOL) 325 mg (65 mg iron) tablet Take one tablet by mouth three times weekly. Take on an empty stomach at least 1 hour before or 2 hours after food.  Indications: anemia from inadequate iron    FLORAJEN DIGESTION 15 billion cell cap Take 1 capsule by mouth every morning.    fluoxetine (PROZAC) 40 mg capsule Take one capsule by mouth at bedtime daily.    furosemide (LASIX) 20 mg tablet Take one tablet by mouth every morning. Indications: visible water retention    gabapentin (NEURONTIN) 100 mg capsule Take one capsule by mouth at bedtime daily.    hydroCHLOROthiazide (HYDRODIURIL) 25 mg tablet Take one tablet by mouth every morning.    hydrOXYzine HCL (ATARAX) 25 mg tablet Take one tablet by mouth every morning. Indications: anxious    ibuprofen (MOTRIN) 800 mg tablet Take one tablet by mouth every 8 hours as needed for Pain. Take with food.  Indications: pain    insulin glargine(+) (LANTUS U-100 INSULIN) 100 unit/mL subcutaneous solution Inject fifteen Units under the skin at bedtime daily. Indications: type 2 diabetes mellitus    lidocaine (LIDODERM) 5 % topical patch Apply one patch topically to affected area daily. Apply patch for 12 hours, then remove for 12 hours before repeating.  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    magnesium oxide (MAGOX) 400 mg (241.3 mg magnesium) tablet Take one tablet by mouth twice daily.    medroxyPROGESTERone (DEPO-PROVERA) 150 mg/mL injection Inject 1 mL into the muscle every 90 days.    melatonin 5 mg chew Chew 3 Gummy by mouth at bedtime as needed.    metFORMIN (GLUCOPHAGE) 1,000 mg tablet Take one tablet by mouth twice daily with meals.    metoprolol tartrate 25 mg tablet Take one-half tablet by mouth twice daily for 30 days. Indications: high blood pressure    Miscellaneous [employer] misc Offloading boot for right foot ulceration    montelukast (SINGULAIR) 10 mg tablet Take one tablet by mouth at bedtime daily.    other medication Medication Name & Strength: AZO Yeast Plus    Dose(how many): 1 tablet    Frequency(how often): three times daily as needed    oxyCODONE (ROXICODONE) 5 mg tablet Take one tablet by mouth every 4 hours as needed. Indications: pain    OZEMPIC 2 mg/dose (8 mg/3 mL) injection PEN Inject two mg under the skin every 7 days. Sundays    PROAIR HFA 90 mcg/actuation inhaler Inhale two puffs by mouth into the lungs every 4 hours as needed.    prochlorperazine maleate (COMPAZINE) 10 mg tablet Take one tablet by mouth every 8 hours as needed for Other... (  take with benadryl as needed for migraine headache).    riboflavin (vitamin B2) 400 mg tablet Take one tablet by mouth daily.    SUMAtriptan succinate (IMITREX) 100 mg tablet Take one tablet by mouth at onset of headache. May repeat after 2 hours if needed. Max of 200 mg in 24 hours.  Indications: a migraine headache, DO NOT TAKE THIS MEDICINE WHILE YOU ARE ON LINEZOLID    tiZANidine (ZANAFLEX) 4 mg tablet Take one-half tablet by mouth daily as needed.    topiramate (TOPAMAX) 50 mg tablet TAKE 1 TABLET BY MOUTH IN THE MORNING AND 2 AT NIGHT FOR TOTAL OF 150 MG DAILY     There were no vitals filed for this visit.  There is no height or weight on file to calculate BMI.     Physical Exam  Vitals reviewed.   Constitutional:       General: She is not in acute distress.  HENT:      Head: Normocephalic and atraumatic.   Eyes:      Conjunctiva/sclera: Conjunctivae normal.   Cardiovascular:      Comments: Unable to palpate pedal pulses through edema  Pulmonary:      Effort: Pulmonary effort is normal.   Musculoskeletal:      Right lower leg: Edema present.      Left lower leg: Edema present.      Comments: There is bilateral and symmetric lymphedema of bilateral lower extremities  Prominent dorsal foot swelling, positive Stemmer sign  Mild ankle cuff sign  Tissue throughout lower legs and thighs is tender on palpation   Skin:     Comments: R plantar foot with ulceration over 1st MTPJ, dressing intact   No erythema     Neurological:      General: No focal deficit present.      Mental Status: She is alert and oriented to person, place, and time.   Psychiatric:         Mood and Affect: Mood normal.         Thought Content: Thought content normal.              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)      Will check for concurrent underlying venous insufficiency with duplex  Given comorbidities will also check ABI's (previously had arterial duplex but only unilaterally)      For lipedema,   we discussed conservative management extensively and I have given her several recommendations for the following goals:    Improve lymphatic Flow  - Continue compression garments, I have given her a Rx today for medical grade knee high stockings  - We discussed starting formal decongestive therapy with a CLT however she would like to prioritize other interventions first and I think this is very reasonable, as it would require 2-3x/week appointments and she really has a lot of other appointments and health related visits at this time, in addition to her full time job  - We also discussed vibration plate, dry brushing, trampoline jumping as some additional things she can try to improve lymphatic flow  - Will order pneumatic compression through [employer]. For the past 30 days the patient has tried elevation, exercise, and compression without significant improvements and present skin discoloration. Patient would benefit from a Lymphedema pump to be  ordered through [employer] to help manage patient condition. Will order    Reduce inflammation  - Avoid ultra-processed foods  - Limit/reduce red meat, dairy if able  - Discussed recent research on anti-inflammatory diet and lipedema   Anti-inflammatory products to ADD into diet:    Oils-  canola, olive, linen   Oily marine fish - salmon, mackerel, herring, sardine, sprat   Nuts - walnuts, almonds, hazelnuts   Seeds - linseed, chia, sesame   Green and black tea, natural coffee   Spices and herbs - basil, rosemary, garlic, pepper, cloves, turmeric   Vegetables - leafy greens, tomatoes, cucumber, onion, zucchini   Avocados   Olives   Cocoa and dark chocolate    Berry fruits - raspberry, strawberry, blackberry, blueberry    - No medications that I am currently aware of that are beneficial in lipedema however we discussed keeping an eye on research into GLP-1 agonists for anti-inflammatory properties, she is on Ozempic currently    Exercise  - Recommend aquatic therapy, walking, weight bearing exercises, stationary bike  - May need to limit and hold off on these while her ulcer is healing    I have also pointed her to the Lipedema Foundation and the Fat Disorders Resource Society as valid additional resources.      For lymphedema,  In addition to the above measures,   Additionally we discussed further weight loss as a truly beneficial treatment, and we discussed the relationship between obesity and lymphedema. We discussed that obesity will independently cause secondary lymphedema seen at [facility] generally between 50-60, where excess weight itself causes venous hypertension and secondary lymphatic obstruction. We discussed that while lymphedema is not totally reversible with weight loss, this would significantly help her symptoms as well as make conservative therapies, ie compression, far more efficacious. It is important to still try therapies such as decongestive therapy, massage, compression garments, and pneumatic  compression but these are not as effective without weight loss. She is actively losing weight on Ozempic     I will see her again in about 6 weeks, sooner if there are any changes or concerns                  Orders Placed This Encounter    PV VENOUS INSUFFICIENCY LOWER EXTREMITY BILATERAL    PV ABI + LOWER ARTERIAL

## 2023-05-19 ENCOUNTER — Ambulatory Visit: Admit: 2023-05-19 | Discharge: 2023-05-19 | Payer: BC Managed Care – PPO | Primary: Family

## 2023-05-19 ENCOUNTER — Encounter: Admit: 2023-05-19 | Discharge: 2023-05-19 | Payer: BC Managed Care – PPO | Primary: Family

## 2023-05-19 DIAGNOSIS — I493 Ventricular premature depolarization: Secondary | ICD-10-CM

## 2023-05-19 DIAGNOSIS — R079 Chest pain, unspecified: Secondary | ICD-10-CM

## 2023-05-19 MED ORDER — ONDANSETRON 4 MG PO TBDI
4 mg | Freq: Once | ORAL | 0 refills | Status: CP
Start: 2023-05-19 — End: ?

## 2023-05-19 NOTE — Telephone Encounter
-----   Message from T [place], DO sent at 05/19/2023  8:28 AM CDT -----  Please let Bri know that her stress test was abnormal. Her exercise was suboptimal but it sounds like she fortunately did not have chest pain or ECG changes with exertion. During the recovery phase she complained of chest discomfort and had frequent PVCs. I wonder if her chest discomfort is due to PVCs. Can we have her wear a 7 day Ziopatch to see her PVC burden and if her chest pain corresponds to PVCs or other arrhythmias. I see that she was recently started on Lopressor. Can we also find out if this helped her symptoms?    TMB

## 2023-05-19 NOTE — Progress Notes
To our valued patient,     We have enrolled your heart monitor and requested it be sent to your home.  You should receive this within 2-3 business days. Please wear the monitor for 14 days. When you have completed the study, please remove the device, and mail it back to the company. Please call [employer] Customer Service at [phone] with questions about placement, troubleshooting, and insurance coverage. You can reach the [facility] ambulatory heart monitor team at [phone].      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)  No data recordedOrdering Provider: Modena Jansky  AMB Monitor Serial Number: Home  No data recorded    Start Time and Date: 05/19/23 10:47 AM   Patient Name: Samantha Valencia [facility]  DOB: Sep 20, 1996 09-22-97  MRN: [id number]  Sex: female  Mobile Phone Number: [phone] (mobile)  Home Phone Number: [phone]  Patient Address: [address] [place] [address]  Insurance Coverage: [facility] OUT OF STATE  Insurance ID: [id number]  Insurance Group #: [id number]  Insurance Subscriber: [place],Wesley MARIE  Implanted Cardiac Device Information: No results found for: [id number]      Patient instructed to contact company phone number on the monitor box with questions regarding billing, placement, troubleshooting.     Samantha Valencia    ____________________________________________________________    Clinic Staff:    Complete additional steps for documentation double check/Co-Sign.  In Follow-up, send chart upon closing encounter to P [facility] HRM AMBULATORY MONITORS    [facility] Ambulatory Monitoring Team:  Schedule on appropriate template and check-in.   Clinic Placement Schedule on clinic location [facility]  schedule   Home Enrollment Schedule on Home Enrollment schedule ([facility])   Given to patient in clinic for self-placement Schedule on Home Enrollment schedule ([facility])   Inpatient Schedule on [place] [facility] AMBULATORY MONITORING template   2. Please enroll with appropriate vendor.

## 2023-05-19 NOTE — Telephone Encounter
05/19/23 @ 0848am- Received VM on nurse line from pt returning call     05/19/23 @ 0852am- Call placed to pt, left VM  requesting return call to nurse line and provided call back number

## 2023-05-19 NOTE — Telephone Encounter
05/19/23 @ 0830am- Call placed to pt, left VM requesting return call to nurse line to discuss below results/recommendations from Dr. Art Buff     No orders placed until pt calls back and is in agreement            ----- Message -----  From: Shaune Leeks, DO  Sent: 05/19/2023   8:28 AM CDT  To: [facility] Nurse Gen Card Team Gold    Please let Bri know that her stress test was abnormal. Her exercise was suboptimal but it sounds like she fortunately did not have chest pain or ECG changes with exertion. During the recovery phase she complained of chest discomfort and had frequent PVCs. I wonder if her chest discomfort is due to PVCs. Can we have her wear a 7 day Ziopatch to see her PVC burden and if her chest pain corresponds to PVCs or other arrhythmias. I see that she was recently started on Lopressor. Can we also find out if this helped her symptoms?    TMB

## 2023-05-22 ENCOUNTER — Encounter: Admit: 2023-05-22 | Discharge: 2023-05-22 | Payer: BC Managed Care – PPO | Primary: Family

## 2023-05-22 NOTE — Telephone Encounter
Called patient to confirm PO Amox Challenge or 7/29 8am. No answer so LVM to call me back; reminded of medications to avoid. Also advised, per Dr. Mikey College, to hold metoprolol the night before and the morning of testing.

## 2023-05-24 ENCOUNTER — Encounter: Admit: 2023-05-24 | Discharge: 2023-05-24 | Payer: BC Managed Care – PPO | Primary: Family

## 2023-05-24 NOTE — Telephone Encounter
See MyChart message

## 2023-05-24 NOTE — Telephone Encounter
RVM from patient letting us know she is having increased pain in wound. She is also experiencing drainage that has not been there for past 2 weeks. She voiced the top of her foot is also warm to the touch. She tried sending in a MyChart to [place] with picture and explanation but would not go through. Message sent to wound team as well. Can see message and picture in chart. Routing to Dr.Poplin to advise. She would like response via MyChart as she is checking that periodically throughout the day.   Jerene Pitch, RN

## 2023-05-25 ENCOUNTER — Emergency Department: Admit: 2023-05-25 | Discharge: 2023-05-25 | Payer: BC Managed Care – PPO

## 2023-05-25 ENCOUNTER — Emergency Department: Admit: 2023-05-25 | Discharge: 2023-05-24 | Payer: BC Managed Care – PPO

## 2023-05-25 MED ADMIN — ONDANSETRON HCL (PF) 4 MG/2 ML IJ SOLN [136012]: 4 mg | INTRAVENOUS | @ 07:00:00 | Stop: 2023-05-25 | NDC 00641607801

## 2023-05-25 MED ADMIN — DIPHENHYDRAMINE HCL 25 MG PO CAP [2509]: 25 mg | ORAL | @ 10:00:00 | Stop: 2023-05-25 | NDC 00904723761

## 2023-05-25 MED ADMIN — OXYCODONE 5 MG PO TAB [10814]: 5 mg | ORAL | @ 07:00:00 | Stop: 2023-05-25 | NDC 00406055223

## 2023-05-25 MED ADMIN — DOXYCYCLINE HYCLATE 100 MG PO TAB [2625]: 100 mg | ORAL | @ 09:00:00 | Stop: 2023-05-25 | NDC 00904043004

## 2023-05-25 MED ADMIN — FENTANYL CITRATE (PF) 50 MCG/ML IJ SOLN [3037]: 50 ug | INTRAVENOUS | @ 07:00:00 | Stop: 2023-05-25 | NDC 00409909412

## 2023-05-25 MED ADMIN — ACETAMINOPHEN 325 MG PO TAB [101]: 650 mg | ORAL | @ 07:00:00 | Stop: 2023-05-25 | NDC 00904677361

## 2023-05-25 MED ADMIN — FAMOTIDINE 20 MG PO TAB [10011]: 20 mg | ORAL | @ 10:00:00 | Stop: 2023-05-25 | NDC 63739064510

## 2023-05-25 MED ADMIN — CEFDINIR 300 MG PO CAP [82406]: 300 mg | ORAL | @ 09:00:00 | Stop: 2023-05-25 | NDC 65862017760

## 2023-05-25 NOTE — ED Notes
Pt came into the ED today due to wound check. Pt has right diabetic foot ulcer and is following infectious disease for treatment. Pt reports 'increasing amount of drainage from ulcer and pain'. Pt rates pain 10/10, constant. Pt denies fever, N/V/D. Pt is A/Ox4, even unlabored breathing, bed is in lowest locked position.    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

## 2023-05-26 ENCOUNTER — Ambulatory Visit: Admit: 2023-05-26 | Discharge: 2023-05-26 | Payer: BC Managed Care – PPO | Primary: Family

## 2023-05-26 ENCOUNTER — Encounter: Admit: 2023-05-26 | Discharge: 2023-05-26 | Payer: BC Managed Care – PPO | Primary: Family

## 2023-05-26 ENCOUNTER — Ambulatory Visit: Admit: 2023-05-26 | Discharge: 2023-05-27 | Payer: BC Managed Care – PPO | Primary: Family

## 2023-05-26 DIAGNOSIS — F419 Anxiety disorder, unspecified: Secondary | ICD-10-CM

## 2023-05-26 DIAGNOSIS — T50905A Adverse effect of unspecified drugs, medicaments and biological substances, initial encounter: Secondary | ICD-10-CM

## 2023-05-26 DIAGNOSIS — R000 Tachycardia, unspecified: Secondary | ICD-10-CM

## 2023-05-26 DIAGNOSIS — Z8742 Personal history of other diseases of the female genital tract: Secondary | ICD-10-CM

## 2023-05-26 DIAGNOSIS — I10 Essential (primary) hypertension: Secondary | ICD-10-CM

## 2023-05-26 DIAGNOSIS — E7800 Pure hypercholesterolemia, unspecified: Secondary | ICD-10-CM

## 2023-05-26 DIAGNOSIS — D539 Nutritional anemia, unspecified: Secondary | ICD-10-CM

## 2023-05-26 DIAGNOSIS — R42 Dizziness and giddiness: Secondary | ICD-10-CM

## 2023-05-26 DIAGNOSIS — I890 Lymphedema, not elsewhere classified: Secondary | ICD-10-CM

## 2023-05-26 DIAGNOSIS — R079 Chest pain, unspecified: Secondary | ICD-10-CM

## 2023-05-26 DIAGNOSIS — E119 Type 2 diabetes mellitus without complications: Secondary | ICD-10-CM

## 2023-05-26 DIAGNOSIS — B999 Unspecified infectious disease: Secondary | ICD-10-CM

## 2023-05-26 DIAGNOSIS — E11621 Type 2 diabetes mellitus with foot ulcer: Secondary | ICD-10-CM

## 2023-05-26 DIAGNOSIS — F32A Depression: Secondary | ICD-10-CM

## 2023-05-26 LAB — PREALBUMIN: PREALBUMIN: 26 mg/dL (ref 17–34)

## 2023-05-26 MED ORDER — MISCELLANEOUS MEDICAL SUPPLY MISC MISC
0 refills | 1.00000 days | Status: AC
Start: 2023-05-26 — End: ?

## 2023-05-26 NOTE — Progress Notes
Name:Samantha Valencia           MRN: [id number]                 DOB:Oct 01, 1996          Age: 27 y.o.  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    family history includes Bipolar Disorder in her mother; Cancer in her maternal grandmother; Depression in her mother; Diabetes in her maternal grandfather; Schizophrenia in her father.    Social History     Socioeconomic History    Marital status: Single   Tobacco Use    Smoking status: Never    Smokeless tobacco: Never   Vaping Use    Vaping status: Never Used   Substance and Sexual Activity    Alcohol use: Never    Drug use: Never    Sexual activity: Yes     Partners: Male     Birth control/protection: Injection       Vaping/E-liquid Use    Vaping Use Never User                   History of Present Illness    Samantha Valencia is a 27 y.o. female. Pt with a history of diabetes presents with wound on bottom of right foot. Pt had been in the hospital for recurrent right lower extremity cellulitis. On initial presentation, wound had thick callus in place but pt stated that she could feel pain under callus. Callus debrided in clinic revealing a fissure present. No signs of infection noted in clinic at that time. Pt was wearing an offloading boot for a majority of the day. Pt with a CGM and last A1C on 01/31/23 was 6.0%. Pt does not smoke. Pt eats a fair amount of protein in her diet. Was regularly wearing compression. Had MRI on 02/24/23 which showed no signs of osteomyelitis. Pt was in hospital in mid-May for cellulitis. Readmitted on 04/05/23 for cellulitis again in her right leg. Pt discharged but noted to have cellulitis again in right leg so admitted to hospital on 04/25/23. Receiving IV antibiotics with plan to transition back to oral medications. Imaging negative. Followed by ID. Continued hydrofera blue and pt followed up in wound clinic. Noted to have some increased drainage but no signs of infection. Stated that offloading boot had worn out but pt using wedge for offloading. Discussed additional forms of offloading and pt stated that she would try crutches. Due to increased drainage, started Aquacel Ag dressing. Pt had further pain and redness in foot so went to ER. Given antibiotics. Wound is clean.     Review of Systems   Skin:  Positive for wound.      Objective:  acetaminophen (TYLENOL) 325 mg tablet Take two tablets by mouth every 4 hours as needed. Indications: pain    atorvastatin (LIPITOR) 10 mg tablet Take one tablet by mouth daily.    cholecalciferol (vitamin D3) (VITAMIN D3 PO) Take 2 Gummy by mouth daily. 2 gummies = 2000 IU    ferrous sulfate (FEOSOL) 325 mg (65 mg iron) tablet Take one tablet by mouth three times weekly. Take on an empty stomach at least 1 hour before or 2 hours after food.  Indications: anemia from inadequate iron    FLORAJEN DIGESTION 15 billion cell cap Take 1 capsule by mouth every morning.    fluoxetine (PROZAC) 40 mg capsule Take one capsule by mouth at bedtime daily.    furosemide (LASIX) 20 mg tablet Take one tablet by mouth every morning. Indications: visible water retention    gabapentin (NEURONTIN) 100 mg capsule Take one capsule by mouth at bedtime daily.    hydroCHLOROthiazide (HYDRODIURIL) 25 mg tablet Take one tablet by mouth every morning.    hydrOXYzine HCL (ATARAX) 25 mg tablet Take one tablet by mouth every morning. Indications: anxious    ibuprofen (MOTRIN) 800 mg tablet Take one tablet by mouth every 8 hours as needed for Pain. Take with food. Indications: pain    insulin glargine(+) (LANTUS U-100 INSULIN) 100 unit/mL subcutaneous solution Inject fifteen Units under the skin at bedtime daily. Indications: type 2 diabetes mellitus    lidocaine (LIDODERM) 5 % topical patch Apply one patch topically to affected area daily. Apply patch for 12 hours, then remove for 12 hours before repeating.  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 Indications: skin and skin structure infection    losartan (COZAAR) 100 mg tablet Take one tablet by mouth at bedtime daily.    magnesium oxide (MAGOX) 400 mg (241.3 mg magnesium) tablet Take one tablet by mouth twice daily.    medroxyPROGESTERone (DEPO-PROVERA) 150 mg/mL injection Inject 1 mL into the muscle every 90 days.    melatonin 5 mg chew Chew 3 Gummy by mouth at bedtime as needed.    metFORMIN (GLUCOPHAGE) 1,000 mg tablet Take one tablet by mouth twice daily with meals.    metoprolol tartrate 25 mg tablet Take one-half tablet by mouth twice daily for 30 days. Indications: high blood pressure    Miscellaneous Medical [employer] misc Offloading boot for right foot ulceration    montelukast (SINGULAIR) 10 mg tablet Take one tablet by mouth at bedtime daily.    other medication Medication Name & Strength: AZO Yeast Plus    Dose(how many): 1 tablet    Frequency(how often): three times daily as needed    oxyCODONE (ROXICODONE) 5 mg tablet Take one tablet by mouth every 4 hours as needed for Pain.    oxyCODONE (ROXICODONE) 5 mg tablet Take one tablet by mouth every 4 hours as needed. Indications: pain    OZEMPIC 2 mg/dose (8 mg/3 mL) injection PEN Inject two mg under the skin every 7 days. Sundays    PROAIR HFA 90 mcg/actuation inhaler Inhale two puffs by mouth into the lungs every 4 hours as needed.    prochlorperazine maleate (COMPAZINE) 10 mg tablet Take one tablet by mouth every 8 hours as needed for Other... (take with benadryl as needed for migraine headache).    riboflavin (vitamin B2) 400 mg tablet Take one tablet by mouth daily.    SUMAtriptan succinate (IMITREX)  100 mg tablet Take one tablet by mouth at onset of headache. May repeat after 2 hours if needed. Max of 200 mg in 24 hours.  Indications: a migraine headache, DO NOT TAKE THIS MEDICINE WHILE YOU ARE ON LINEZOLID    tiZANidine (ZANAFLEX) 4 mg tablet Take one-half tablet by mouth daily as needed.    topiramate (TOPAMAX) 50 mg tablet TAKE 1 TABLET BY MOUTH IN THE MORNING AND 2 AT NIGHT FOR TOTAL OF 150 MG DAILY     Vitals:    05/26/23 0916   BP: 121/70   Pulse: 82   Temp: 36.3 ?C (97.4 ?F)   SpO2: 99%   PainSc: Seven     There is no height or weight on file to calculate BMI.   Physical Exam  Constitutional:       Appearance: Normal appearance.   Eyes:      Conjunctiva/sclera: Conjunctivae normal.      Pupils: Pupils are equal, round, and reactive to light.   Cardiovascular:      Rate and Rhythm: Normal rate.   Pulmonary:      Effort: Pulmonary effort is normal.   Musculoskeletal:         General: Normal range of motion.      Cervical back: Normal range of motion.   Skin:     General: Skin is warm and dry.   Neurological:      Mental Status: She is alert. Mental status is at baseline.   Psychiatric:         Mood and Affect: Mood normal.         Thought Content: Thought content normal.           Wounds Pressure injury Right;Plantar (Active)   03/07/23 0935   Wound Type: Pressure injury   Orientation: Right;Plantar   Location:    Wound Location Comments:    Initial Wound Site Closure:    Initial Dressing Placed:    Initial Cycle:    Initial Suction Setting (mmHg):    Pressure Injury Stages:    Pressure Injury Present Within 24 Hours of Hospital Admission:    If This Pressure Injury Is Suspected to Be Device Related, Please Select the Device::    Is the Wound Open or Closed:    Image   05/26/23 0900   Wound Assessment Moist;Red 05/26/23 0900   Peri-wound Assessment Callous 05/26/23 0900   Wound Drainage Amount Moderate 05/26/23 0900   Wound Drainage Description Serosanguineous 05/26/23 0900   Wound Dressing Status Intact 05/26/23 0900   Wound Care Dressing changed or new application 05/10/23 1400   Wound Dressing and/or Treatment Aquacel AG;Dry gauze;Hypafix tape;Compressor grip 05/26/23 0900   Wound Status (Wound Team Only) Being Treated 05/26/23 0900   Wound Length (cm) (Wound Team Only) 1 cm 05/26/23 0900   Wound Width (cm) (Wound Team Only) 0.5 cm 05/26/23 0900   Wound Depth (cm) (Wound Team Only) 0.1 cm 05/26/23 0900   Wound Surface Area (cm^2) (Wound Team Only) 0.5 cm^2 05/26/23 0900   Wound Volume (cm^3) (Wound Team Only) 0.05 cm^3 05/26/23 0900   Wound Healing % (Wound Team Only) -108.33 05/26/23 0900   Undermining in CM (Wound Team Only) 0 cm 05/26/23 0900   Tunneling in CM (Wound Team Only) 0 cm 05/26/23 0900   Number of days: 80     Vascular status: Normal right arterial ultrasound  Prealbumin: 26  Hgb A1C: 6.0  ALL wounds debrided using: mechanical     Assessment and Plan:  Diabetic foot ulcer, right plantar foot, fat layer exposed  - Aquacel Ag dressing, cover with ABD, secure with hypafix, change daily  - Keep clean and dry  - Monitor and report any signs of infection  - Continue antibiotics per ID  - Post-op shoe for offloading, order for replacement offloading boot  - Compression daily  - Elevate feet higher than level of heart at least 20 minutes twice a day  - Maintain adequate glucose levels  - Protein in diet, check prealbumin today  - Follow-up in 2 weeks with associated TCOM    - Potentially consider HBO treatments in the future given multiple recurrences of infection    Total Time Today was 30 minutes in the following activities: Preparing to see the patient, Performing a medically appropriate examination and/or evaluation, Counseling and educating the patient/family/caregiver, Ordering medications, tests, or procedures, Documenting clinical information in the electronic or other health record, and Independently interpreting results (not separately reported) and communicating results to the patient/family/caregiver

## 2023-05-27 DIAGNOSIS — L97412 Non-pressure chronic ulcer of right heel and midfoot with fat layer exposed: Secondary | ICD-10-CM

## 2023-05-27 DIAGNOSIS — E11621 Type 2 diabetes mellitus with foot ulcer: Secondary | ICD-10-CM

## 2023-05-29 ENCOUNTER — Encounter: Admit: 2023-05-29 | Discharge: 2023-05-29 | Payer: BC Managed Care – PPO | Primary: Family

## 2023-05-29 NOTE — Telephone Encounter
Received VM from patient needing to cancel PCN challenge scheduled for 7/29.    Called patient to reschedule challenge; no answer so LVM to call me back.

## 2023-06-07 ENCOUNTER — Encounter: Admit: 2023-06-07 | Discharge: 2023-06-07 | Payer: BC Managed Care – PPO | Primary: Family

## 2023-06-08 ENCOUNTER — Encounter: Admit: 2023-06-08 | Discharge: 2023-06-08 | Payer: BC Managed Care – PPO | Primary: Family

## 2023-06-08 DIAGNOSIS — A0472 Enterocolitis due to Clostridium difficile, not specified as recurrent: Secondary | ICD-10-CM

## 2023-06-08 MED ORDER — VANCOMYCIN 125 MG PO CAP
ORAL_CAPSULE | ORAL | 0 refills | 10.00000 days | Status: AC
Start: 2023-06-08 — End: ?

## 2023-06-09 ENCOUNTER — Encounter: Admit: 2023-06-09 | Discharge: 2023-06-09 | Payer: BC Managed Care – PPO | Primary: Family

## 2023-06-09 ENCOUNTER — Ambulatory Visit: Admit: 2023-06-09 | Discharge: 2023-06-09 | Payer: BC Managed Care – PPO | Primary: Family

## 2023-06-09 DIAGNOSIS — F32A Depression: Secondary | ICD-10-CM

## 2023-06-09 DIAGNOSIS — E11621 Type 2 diabetes mellitus with foot ulcer: Secondary | ICD-10-CM

## 2023-06-09 DIAGNOSIS — T50905A Adverse effect of unspecified drugs, medicaments and biological substances, initial encounter: Secondary | ICD-10-CM

## 2023-06-09 DIAGNOSIS — E119 Type 2 diabetes mellitus without complications: Secondary | ICD-10-CM

## 2023-06-09 DIAGNOSIS — F419 Anxiety disorder, unspecified: Secondary | ICD-10-CM

## 2023-06-09 DIAGNOSIS — I10 Essential (primary) hypertension: Secondary | ICD-10-CM

## 2023-06-09 DIAGNOSIS — R42 Dizziness and giddiness: Secondary | ICD-10-CM

## 2023-06-09 DIAGNOSIS — D539 Nutritional anemia, unspecified: Secondary | ICD-10-CM

## 2023-06-09 DIAGNOSIS — E7800 Pure hypercholesterolemia, unspecified: Secondary | ICD-10-CM

## 2023-06-09 DIAGNOSIS — B999 Unspecified infectious disease: Secondary | ICD-10-CM

## 2023-06-09 DIAGNOSIS — R079 Chest pain, unspecified: Secondary | ICD-10-CM

## 2023-06-09 DIAGNOSIS — Z8742 Personal history of other diseases of the female genital tract: Secondary | ICD-10-CM

## 2023-06-09 DIAGNOSIS — R000 Tachycardia, unspecified: Secondary | ICD-10-CM

## 2023-06-09 DIAGNOSIS — I890 Lymphedema, not elsewhere classified: Secondary | ICD-10-CM

## 2023-06-09 NOTE — Progress Notes
Measurement: 88       Room Air mmHg: 79  Location Description: RLE medial proximal to knee   Oxygen Challenge (1 Min): 109   Oxygen Challenge (2 Min): 132   Oxygen Challenge (3 Min): 135   Oxygen Challenge (4 Min): 139   Oxygen Challenge (5 Min): 144   Oxygen Challenge (10 Min): 174    Room Air mmHg: 90  Location Description: RLE lateral distal to knee   Oxygen Challenge (1 Min): 136   Oxygen Challenge (2 Min): 159   Oxygen Challenge (3 Min): 169   Oxygen Challenge (4 Min): 175   Oxygen Challenge (5 Min): 180   Oxygen Challenge (10 Min): 222    Room Air mmHg: 85  Location Description: RLE medial proximal to ankle   Oxygen Challenge (1 Min): 111   Oxygen Challenge (2 Min): 135   Oxygen Challenge (3 Min): 133   Oxygen Challenge (4 Min): 146   Oxygen Challenge (5 Min): 156   Oxygen Challenge (10 Min): 204    Room Air mmHg: 60  Location Description: rt dorsal foot proximal lateral   Oxygen Challenge (1 Min): 92   Oxygen Challenge (2 Min): 116   Oxygen Challenge (3 Min): 105   Oxygen Challenge (4 Min): 110   Oxygen Challenge (5 Min): 107   Oxygen Challenge (10 Min): 121    Room Air mmhg: 68  Location Description: lt dorsal foot medial distal   Oxygen Challenge (1 Min): 109   Oxygen Challenge (2 Min): 163   Oxygen Challenge (3 Min): 175   Oxygen Challenge (4 Min): 181   Oxygen Challenge (5 Min): 186   Oxygen Challenge (10 Min): 229           No diagnosis found.     Interpretation: All leads show normal tissue oxygenation while breathing room air. Additionally, all leads show an appropriate rise in tissue oxygenation while breathing 100% oxygen.    Discussion:   Transcutaneous oxygen measurements (T-COM) provide a direct, quantitative assessment of oxygen availability to periwound skin and are an indirect measurement of periwound microcirculatory blood flow.  This allows for the objective determination of the presence and degree of hypoxia and serves as a screening tool to identify patients at risk for failure of primary wound or amputation flap healing.  T-COM are a more accurate reflection of changes in perfusion than are measurements of the ankle brachial index (ABI).    Transcutaneous Oximetry Measurements are generally accepted is most useful for: 1) predicting failure to heal the wound without intervention, 2 ) failure to heal a planned amputation, 3) failure to respond to hyperbaric oxygen therapy, 4) evaluating the success of revascularization as technical grafting success does not necessarily equate with limb salvage.    The additional provocative testing with lower extremity elevation may increase the sensitivity of the test as a screening tool for detecting occult lower extremity arterial insufficiency.    Periwound Transcutaneous Oximetry Measurement values below 20 mmHg are associated with a 39-fold increased risk of primary healing failure.  Transcutaneous Oximetry Measurement values less than 35 mmHg obtained while breathing 100% oxygen at sea level are associated with a 41% failure rate of subsequent hyperbaric oxygen therapy while values greater than 35 mmHg are associated with the 69% likelihood of beneficial response.  In-chamber transcutaneous oximetry measurements greater than 200 mmHg are 74% reliable in predicting wound healing with limb salvage as a result of hyperbaric oxygen therapy.  However, the failure rate for less  than 200 mmHg is not 100% so it is reasonable to give a trial of 10-15 hyperbaric oxygen treatments to patients for whom the alternative is amputation.  A Transcutaneous Oximetry Measurement of greater than 200 mmHg has an 88% predicted value of healing a diabetic foot ulcer.      Regional Perfusion Index:   RBI provides a baseline for normally perfused skin.  65-90 is normal.  If greater than 90, consider technical error.  RPE has not been proven to be of use in hyperbaric oxygen application.  Pulse oximetry with RPI may be useful in validating Transcutaneous Oxygen Measurements.

## 2023-06-09 NOTE — Progress Notes
Name:Samantha Valencia           MRN: [id number]                 DOB:02/13/1996          Age: 27 y.o.  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    family history includes Bipolar Disorder in her mother; Cancer in her maternal grandmother; Depression in her mother; Diabetes in her maternal grandfather; Schizophrenia in her father.    Social History     Socioeconomic History    Marital status: Single   Tobacco Use    Smoking status: Never    Smokeless tobacco: Never   Vaping Use    Vaping status: Never Used   Substance and Sexual Activity    Alcohol use: Never    Drug use: Never    Sexual activity: Yes     Partners: Male     Birth control/protection: Injection       Vaping/E-liquid Use    Vaping Use Never User                   History of Present Illness    Samantha Valencia is a 27 y.o. female. Pt with a history of diabetes presents with wound on bottom of right foot. Pt had been in the hospital for recurrent right lower extremity cellulitis. On initial presentation, wound had thick callus in place but pt stated that she could feel pain under callus. Callus debrided in clinic revealing a fissure present. No signs of infection noted in clinic at that time. Pt was wearing an offloading boot for a majority of the day. Pt with a CGM and last A1C on 01/31/23 was 6.0%. Pt does not smoke. Pt eats a fair amount of protein in her diet. Was regularly wearing compression. Had MRI on 02/24/23 which showed no signs of osteomyelitis. Pt was in hospital in mid-May for cellulitis. Readmitted on 04/05/23 for cellulitis again in her right leg. Pt discharged but noted to have cellulitis again in right leg so admitted to hospital on 04/25/23. Receiving IV antibiotics with plan to transition back to oral medications. Imaging negative. Followed by ID. Continued hydrofera blue and pt followed up in wound clinic. Noted to have some increased drainage but no signs of infection. Stated that offloading boot had worn out but pt using wedge for offloading. Discussed additional forms of offloading and pt stated that she would try crutches. Due to increased drainage, started Aquacel Ag dressing. Pt had further pain and redness in foot so went to ER and was given antibiotics. Wound is clean but not improving.      History of wound  - 01/31/23- Hospitalized for cellulitis  - 02/23/23- Hospitalized  for cellulitis  - 03/07/23- Initial presentation to wound clinic, noted to have had recurrent cellulitis in the right foot/leg, treated with hydrofera blue  - 03/14/23- Hospitalized for sepsis secondary to cellulitis, suspected to have septic arthritis, ID recommended suppressive antibiotics but had reaction to PCN and stopped  - 03/24/23- Seen in wound clinic, switched to hydrofera blue dressing  - 04/05/23- Hospitalized for cellulitis  - 04/25/23- Hospitalized for cellulitis  - 05/01/23- Hospitalized for cellulitis though ID felt that this was more consistent with lymphedema, ordered suppressive therapy with Augmentin  - 05/10/23- Seen in wound clinic, wound 0.5 x 0.4 x 0.3  - 05/26/23- Seen in wound clinic, wound draining more so switched to Aquacel, wound 1 x 0.5 x 0.1  - 06/09/23- Seen in wound clinic, wound 1 x 0.7 x 0.1    TCOM accomplished with visit on 06/09/23 which showed adequate tissue oxygenation on room air with a notable rise in oxygenation while breathing 100% O2.   Room Air mmHg: 79  Location Description: RLE medial proximal to knee               Oxygen Challenge (1 Min): 109               Oxygen Challenge (2 Min): 132               Oxygen Challenge (3 Min): 135               Oxygen Challenge (4 Min): 139               Oxygen Challenge (5 Min): 144               Oxygen Challenge (10 Min): 174     Room Air mmHg: 90  Location Description: RLE lateral distal to knee               Oxygen Challenge (1 Min): 136               Oxygen Challenge (2 Min): 159               Oxygen Challenge (3 Min): 169               Oxygen Challenge (4 Min): 175               Oxygen Challenge (5 Min): 180               Oxygen Challenge (10 Min): 222     Room Air mmHg: 85  Location Description: RLE medial proximal to ankle               Oxygen Challenge (1 Min): 111               Oxygen Challenge (2 Min): 135               Oxygen Challenge (3 Min): 133               Oxygen Challenge (4 Min): 146               Oxygen Challenge (5 Min): 156               Oxygen Challenge (10 Min): 204     Room Air mmHg: 60  Location Description: rt dorsal foot proximal lateral               Oxygen Challenge (1 Min): 92  Oxygen Challenge (2 Min): 116               Oxygen Challenge (3 Min): 105               Oxygen Challenge (4 Min): 110               Oxygen Challenge (5 Min): 107               Oxygen Challenge (10 Min): 121     Room Air mmhg: 68  Location Description: lt dorsal foot medial distal               Oxygen Challenge (1 Min): 109               Oxygen Challenge (2 Min): 163               Oxygen Challenge (3 Min): 175               Oxygen Challenge (4 Min): 181               Oxygen Challenge (5 Min): 186               Oxygen Challenge (10 Min): 229    Pt has a known diabetic foot ulcer with several episodes of infection, one of which was suspected to be septic arthritis. The wound had been treated appropriately with dressing and offloading with little to no progress towards healing. She had relatively controlled diabetes with an A1C of 6.0% and good nutrition status with a prealbumin of 26. Pt has a lower extremity diabetic foot ulcer with infection that is not requiring surgical procedure and is not healing with standard treatment. Treatment has included vascular assessment which was normal, debridement of wound and callus as indicated, offloading with walking boot, infection control as much as possible including ID directed suppressive therapy, nutrition optimized as shown by prealbumin, and relatively adequate glucose control as shown by A1C. This wound has been treated for >30 days without notable signs of healing. Following the [facility] standard pt selection for hyperbaric treatments for DFU, pt would qualify to add hyperbaric treatments to her treatment regimen. Pt is aware that treatments would be performed daily and she stated that she is willing to commute to get these accomplished.     CXR on 05/01/23 showed no PTX    Venous and arterial doppler exams showed no obstruction      At the time of initial consultation, the risks and benefits of hyperbaric oxygen therapy were discussed with the patient. The benefits include raising the tissue oxygen levels in order to enhance healing of difficult wounds or to reverse toxic effects of chemicals and inhaled gases. Potential risks include ear, sinus, tooth, or pulmonary barotraumas. This can result in pain and discomfort in the ears, sinuses, or teeth, and rarely to pneumothorax of the lung. There are several potential eye changes, most commonly slight worsening of far vision with improvement of near vision. This is reversible without intervention in most cases. Rarely, certain types of cataracts may mature more quickly than in patients not treated with hyperbaric oxygen. There is a remote risk of fire. We have discussed the list of prohibited items that will not be allowed into the chamber at any time. This list of items reduces the risk of catastrophic fire. Finally, we discussed the risk of oxygen toxicity that can be manifest as a seizure or lung changes. The risks and benefits were explained in  detail.  Alternatives to this treatment include: no hyperbaric oxygen therapy.   The patient verbalized understanding of this discussion and was given opportunity to ask questions and have them answered satisfactorily. Additional education is provided to each patient by the nursing and technical staff of the hyperbaric chamber. There is ample opportunity for the patient to ask questions at any time. Verbal acknowledgement of this understanding and consent for treatment was obtained prior to treating the patient in the hyperbaric chamber.    Indication for Hyperbaric Oxygen Therapy (HBOT):  Diabetic patients are at high risk for developing foot ulcers due to neuropathy and peripheral arterial occlusive disease. The pathophysiology of diabetic foot ulcers include progressive development of a sensory, motor and autonomic neuropathy leading to loss of protective sensation, deformity causing increased plantar foot pressure, and alterations in autoregulation of dermal blood flow. Diabetics show advanced peripheral vascular disease generally at the trifurcation level just below the knee.     Neuropathy, vascular disease, impaired white blood cell response to infection, and cellular dysfunction all contribute to the poor clinical outcomes of diabetic foot ulcers. Despite standard wound care, these foot ulcerations can progress and are associated with cellulitis, deep tissue necrosis, abscess formation, and the development of osteomyelitis. This type of ulcer is a Wagner grade III ulcer, an equivalent of the [place] IIB, IID, IIIB, or IIID ulcers. Progression to frank distal foot gangrene (Wagner grade IV) or gangrene involving the whole foot (Wagner grade V) can occur.     Hyperbaric oxygen therapy has been proven to be a beneficial adjunct to advanced wound care in diabetic foot ulcers meeting the following criteria: 1) the patient has type 1 or 2 diabetes and a lower extremity ulcer due to diabetes, 2) the ulcer is a Wagner grade III or higher, and 3) the patient has failed a 30-day standard wound therapy regimen that included assessment and attempts to correct vascular abnormalities, optimizing diabetic control and nutrition, debridement, moist wound dressing, off-loading, and treatment of underlying infection.    Hyperbaric Plan: A typical hyperbaric regimen for a patient with a Wagner III or worse diabetic foot ulcer consists of daily 2.0 to 2.5 ATA hyperbaric oxygen treatments with 90 to 120 minutes of oxygen breathing time. This continues until the tissue has stabilized and the patient demonstrates progress toward healing. Because patients with diabetic foot ulcers also have a high incidence of vascular inflow disease, these patients will be carefully monitored for vascular flow and hyperbaric therapy may be dictated by serial transcutaneous oximetry readings.      Review of Systems   Skin:  Positive for wound.      Objective:          acetaminophen (TYLENOL) 325 mg tablet Take two tablets by mouth every 4 hours as needed. Indications: pain    atorvastatin (LIPITOR) 10 mg tablet Take one tablet by mouth daily.    cholecalciferol (vitamin D3) (VITAMIN D3 PO) Take 2 Gummy by mouth daily. 2 gummies = 2000 IU    ferrous sulfate (FEOSOL) 325 mg (65 mg iron) tablet Take one tablet by mouth three times weekly. Take on an empty stomach at least 1 hour before or 2 hours after food.  Indications: anemia from inadequate iron    FLORAJEN DIGESTION 15 billion cell cap Take 1 capsule by mouth every morning.    fluoxetine (PROZAC) 40 mg capsule Take one capsule by mouth at bedtime daily.    furosemide (LASIX) 20 mg tablet Take one tablet  by mouth every morning. Indications: visible water retention    gabapentin (NEURONTIN) 100 mg capsule Take one capsule by mouth at bedtime daily.    hydroCHLOROthiazide (HYDRODIURIL) 25 mg tablet Take one tablet by mouth every morning.    hydrOXYzine HCL (ATARAX) 25 mg tablet Take one tablet by mouth every morning. Indications: anxious    ibuprofen (MOTRIN) 800 mg tablet Take one tablet by mouth every 8 hours as needed for Pain. Take with food.  Indications: pain    insulin glargine(+) (LANTUS U-100 INSULIN) 100 unit/mL subcutaneous solution Inject fifteen Units under the skin at bedtime daily. Indications: type 2 diabetes mellitus    lidocaine (LIDODERM) 5 % topical patch Apply one patch topically to affected area daily. Apply patch for 12 hours, then remove for 12 hours before repeating.  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    magnesium oxide (MAGOX) 400 mg (241.3 mg magnesium) tablet Take one tablet by mouth twice daily.    medroxyPROGESTERone (DEPO-PROVERA) 150 mg/mL injection Inject 1 mL into the muscle every 90 days.    melatonin 5 mg chew Chew 3 Gummy by mouth at bedtime as needed.    metFORMIN (GLUCOPHAGE) 1,000 mg tablet Take one tablet by mouth twice daily with meals.    Miscellaneous Medical Supply misc 1 set of crutches    Miscellaneous Medical Supply misc Offloading boot for right foot ulceration    montelukast (SINGULAIR) 10 mg tablet Take one tablet by mouth at bedtime daily.    other medication Medication Name & Strength: AZO Yeast Plus    Dose(how many): 1 tablet    Frequency(how often): three times daily as needed    oxyCODONE (ROXICODONE) 5 mg tablet Take one tablet by mouth every 4 hours as needed for Pain.    oxyCODONE (ROXICODONE) 5 mg tablet Take one tablet by mouth every 4 hours as needed. Indications: pain    OZEMPIC 2 mg/dose (8 mg/3 mL) injection PEN Inject two mg under the skin every 7 days. Sundays    PROAIR HFA 90 mcg/actuation inhaler Inhale two puffs by mouth into the lungs every 4 hours as needed.    prochlorperazine maleate (COMPAZINE) 10 mg tablet Take one tablet by mouth every 8 hours as needed for Other... (take with benadryl as needed for migraine headache).    riboflavin (vitamin B2) 400 mg tablet Take one tablet by mouth daily.    SUMAtriptan succinate (IMITREX) 100 mg tablet Take one tablet by mouth at onset of headache. May repeat after 2 hours if needed. Max of 200 mg in 24 hours.  Indications: a migraine headache, DO NOT TAKE THIS MEDICINE WHILE YOU ARE ON LINEZOLID    tiZANidine (ZANAFLEX) 4 mg tablet Take one-half tablet by mouth daily as needed.    topiramate (TOPAMAX) 50 mg tablet TAKE 1 TABLET BY MOUTH IN THE MORNING AND 2 AT NIGHT FOR TOTAL OF 150 MG DAILY    vancomycin (VANCOCIN) 125 mg capsule Take one capsule by mouth twice daily for 7 days, THEN one capsule daily for 7 days, THEN one capsule every 48 hours for 14 days. The day after you finish the previous prescription for vancomycin 125 mg po QID, start with this taper.     Vitals:    06/09/23 0856   BP: (!) 141/63   BP Source: Arm, Right Upper   Pulse: 80   Temp: 36.6 ?C (97.8 ?F)  Resp: 20   SpO2: 100%   TempSrc: Oral   PainSc: Zero     There is no height or weight on file to calculate BMI.   Physical Exam  Constitutional:       Appearance: Normal appearance.   HENT:      Right Ear: Tympanic membrane normal.      Left Ear: Tympanic membrane normal.   Eyes:      Conjunctiva/sclera: Conjunctivae normal.      Pupils: Pupils are equal, round, and reactive to light.   Cardiovascular:      Rate and Rhythm: Normal rate and regular rhythm.   Pulmonary:      Effort: Pulmonary effort is normal.      Breath sounds: Normal breath sounds.   Musculoskeletal:         General: Normal range of motion.      Cervical back: Normal range of motion.   Skin:     General: Skin is warm and dry.      Capillary Refill: Capillary refill takes less than 2 seconds.   Neurological:      Mental Status: She is alert. Mental status is at baseline.   Psychiatric:         Mood and Affect: Mood normal.         Thought Content: Thought content normal.           Wounds Pressure injury Right;Plantar (Active)   03/07/23 0935   Wound Type: Pressure injury Orientation: Right;Plantar   Location:    Wound Location Comments:    Initial Wound Site Closure:    Initial Dressing Placed:    Initial Cycle:    Initial Suction Setting (mmHg):    Pressure Injury Stages:    Pressure Injury Present Within 24 Hours of Hospital Admission:    If This Pressure Injury Is Suspected to Be Device Related, Please Select the Device::    Is the Wound Open or Closed:    Image   06/09/23 0800   Wound Assessment Moist;Red 06/09/23 0800   Peri-wound Assessment Dry;Callous 06/09/23 0800   Wound Drainage Amount Moderate 06/09/23 0800   Wound Drainage Description Serosanguineous 06/09/23 0800   Wound Dressing Status Intact 05/26/23 0900   Wound Care Dressing changed or new application 06/09/23 0800   Wound Dressing and/or Treatment Aquacel AG;Dry gauze;Hypafix tape 05/26/23 0900   Wound Status (Wound Team Only) Being Treated 06/09/23 0800   Wound Length (cm) (Wound Team Only) 1 cm 06/09/23 0800   Wound Width (cm) (Wound Team Only) 0.7 cm 06/09/23 0800   Wound Depth (cm) (Wound Team Only) 0.1 cm 06/09/23 0800   Wound Surface Area (cm^2) (Wound Team Only) 0.7 cm^2 06/09/23 0800   Wound Volume (cm^3) (Wound Team Only) 0.07 cm^3 06/09/23 0800   Wound Healing % (Wound Team Only) -191.67 06/09/23 0800   Undermining in CM (Wound Team Only) 0 cm 06/09/23 0800   Tunneling in CM (Wound Team Only) 0 cm 06/09/23 0800   Number of days: 94   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    Adequate off-loading of diabetic foot ulcer Right Offloading boot  The patients wound is at Wounds Pressure injury Right;Plantar-Wound Healing % (Wound Team Only): -191.67   Patient evaluated for peripheral neuropathy  Diabetic Foot Exam       Bilateral vascular, sensation, integument are normal:  No  Vascular Status    Left: Right:dorsalis pedis biphasic   [place]  Touch Test    Left 1st:  Left 3rd:  Left 5th: Right 1st:negative  Right 3rd:negative  Right 5th:negative   Skin Integrity    Left: Right:Callus and Ulcerations   Foot Structure    Left: Right:charcot        There is no height or weight on file to calculate BMI.   Albumin   Date Value Ref Range Status   05/25/2023 4.2 3.5 - 5.0 G/DL Final     Prealbumin   Date Value Ref Range Status   05/26/2023 26.0 17 - 34 MG/DL Final     Patients nutritional status is adequate. The plan is to continue daily protein intake  Diabetes appears to be relatively controlled with a HgA1c core of 6.0%    Patient educated on ulcer prevention   Patient is noted to have family assistance at home  Obstacles to treatment include: distance to HBO center  Social History     Socioeconomic History    Marital status: Single   Tobacco Use    Smoking status: Never    Smokeless tobacco: Never   Vaping Use    Vaping status: Never Used   Substance and Sexual Activity    Alcohol use: Never    Drug use: Never    Sexual activity: Yes     Partners: Male     Birth control/protection: Injection                          ALL wounds debrided using: mechanical     Assessment and Plan:  Diabetic foot ulcer, right plantar foot, fat layer exposed  - Aquacel Ag dressing, cover with ABD, secure with hypafix, change daily  - Keep clean and dry  - Monitor and report any signs of infection  - Continue antibiotics per ID  - Post-op shoe for offloading, order for replacement offloading boot  - Compression daily  - Elevate feet higher than level of heart at least 20 minutes twice a day  - Maintain adequate glucose levels  - Follow-up in 3 weeks     - Will begin process for scheduling HBO treatments    Total Time Today was 40 minutes in the following activities: Preparing to see the patient, Performing a medically appropriate examination and/or evaluation, Counseling and educating the patient/family/caregiver, Documenting clinical information in the electronic or other health record, Independently interpreting results (not separately reported) and communicating results to the patient/family/caregiver, and Care coordination (not separately reported)

## 2023-06-14 ENCOUNTER — Encounter: Admit: 2023-06-14 | Discharge: 2023-06-14 | Payer: BC Managed Care – PPO | Primary: Family

## 2023-06-14 DIAGNOSIS — E11621 Type 2 diabetes mellitus with foot ulcer: Secondary | ICD-10-CM

## 2023-06-14 DIAGNOSIS — L97412 Non-pressure chronic ulcer of right heel and midfoot with fat layer exposed: Secondary | ICD-10-CM

## 2023-06-23 ENCOUNTER — Ambulatory Visit: Admit: 2023-06-23 | Discharge: 2023-06-24 | Payer: BC Managed Care – PPO | Primary: Family

## 2023-06-23 ENCOUNTER — Encounter: Admit: 2023-06-23 | Discharge: 2023-06-23 | Payer: BC Managed Care – PPO | Primary: Family

## 2023-06-23 DIAGNOSIS — L03115 Cellulitis of right lower limb: Secondary | ICD-10-CM

## 2023-06-23 NOTE — Progress Notes
History of Present Illness:     27 y/o with a history of DM2, diabetic foot wound who presents for follow up of recurrent RLE cellulitis.    Pertinent Hx:  Since 01/2023 Pt has had multiple admissions for RLE swelling, pain and erythema with concern for cellulitis. She has improved rapidly with antibiotics. Due to recurrences suppressive antibiotics with penicillin, cephalexin and Augmentin have been tried however Pt has not been able to tolerate them due to various side effects. In 05/2023 she had penicillin skin testing that had negative reaction but develop pruritus and nausea afterwards w/o rash.     Today's visit (06/23/23, telehealth)  Since her hospitalization in July 2024 she reports developing bloody diarrhea. She was seen by her PCP and reports testing positive for C diff. She states she took PO Vancomycin and her diarrhea improved. Her stools are now formed but intermittently will have some blood in her stools.     In regards to her R leg she now has lymphedema pumps and states her leg is doing better than it has been in the past. She notes her wound his improving as well and she is set up for hyperbaric therapy in October.          Review of systems:  ROS as above.      Allergies:    Allergies   Allergen Reactions    Pcn [Penicillins] HIVES    Iodinated Contrast Media ITCHING    Keflex [Cephalexin] UNKNOWN         acetaminophen (TYLENOL) 325 mg tablet Take two tablets by mouth every 4 hours as needed. Indications: pain    atorvastatin (LIPITOR) 10 mg tablet Take one tablet by mouth daily.    cholecalciferol (vitamin D3) (VITAMIN D3 PO) Take 2 Gummy by mouth daily. 2 gummies = 2000 IU    ferrous sulfate (FEOSOL) 325 mg (65 mg iron) tablet Take one tablet by mouth three times weekly. Take on an empty stomach at least 1 hour before or 2 hours after food.  Indications: anemia from inadequate iron    FLORAJEN DIGESTION 15 billion cell cap Take 1 capsule by mouth every morning.    fluoxetine (PROZAC) 40 mg capsule Take one capsule by mouth at bedtime daily.    furosemide (LASIX) 20 mg tablet Take one tablet by mouth every morning. Indications: visible water retention    gabapentin (NEURONTIN) 100 mg capsule Take one capsule by mouth at bedtime daily.    hydroCHLOROthiazide (HYDRODIURIL) 25 mg tablet Take one tablet by mouth every morning.    hydrOXYzine HCL (ATARAX) 25 mg tablet Take one tablet by mouth every morning. Indications: anxious    ibuprofen (MOTRIN) 800 mg tablet Take one tablet by mouth every 8 hours as needed for Pain. Take with food.  Indications: pain    insulin glargine(+) (LANTUS U-100 INSULIN) 100 unit/mL subcutaneous solution Inject fifteen Units under the skin at bedtime daily. Indications: type 2 diabetes mellitus    lidocaine (LIDODERM) 5 % topical patch Apply one patch topically to affected area daily. Apply patch for 12 hours, then remove for 12 hours before repeating.  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    magnesium oxide (MAGOX) 400 mg (241.3 mg magnesium) tablet Take one tablet by mouth twice daily.    medroxyPROGESTERone (DEPO-PROVERA) 150 mg/mL injection Inject 1 mL into the muscle every 90 days.    melatonin 5  mg chew Chew 3 Gummy by mouth at bedtime as needed.    metFORMIN (GLUCOPHAGE) 1,000 mg tablet Take one tablet by mouth twice daily with meals.    Miscellaneous Medical Supply misc 1 set of crutches    Miscellaneous Medical Supply misc Offloading boot for right foot ulceration    montelukast (SINGULAIR) 10 mg tablet Take one tablet by mouth at bedtime daily.    other medication Medication Name & Strength: AZO Yeast Plus    Dose(how many): 1 tablet    Frequency(how often): three times daily as needed    oxyCODONE (ROXICODONE) 5 mg tablet Take one tablet by mouth every 4 hours as needed for Pain.    oxyCODONE (ROXICODONE) 5 mg tablet Take one tablet by mouth every 4 hours as needed. Indications: pain OZEMPIC 2 mg/dose (8 mg/3 mL) injection PEN Inject two mg under the skin every 7 days. Sundays    PROAIR HFA 90 mcg/actuation inhaler Inhale two puffs by mouth into the lungs every 4 hours as needed.    prochlorperazine maleate (COMPAZINE) 10 mg tablet Take one tablet by mouth every 8 hours as needed for Other... (take with benadryl as needed for migraine headache).    riboflavin (vitamin B2) 400 mg tablet Take one tablet by mouth daily.    SUMAtriptan succinate (IMITREX) 100 mg tablet Take one tablet by mouth at onset of headache. May repeat after 2 hours if needed. Max of 200 mg in 24 hours.  Indications: a migraine headache, DO NOT TAKE THIS MEDICINE WHILE YOU ARE ON LINEZOLID    tiZANidine (ZANAFLEX) 4 mg tablet Take one-half tablet by mouth daily as needed.    topiramate (TOPAMAX) 50 mg tablet TAKE 1 TABLET BY MOUTH IN THE MORNING AND 2 AT NIGHT FOR TOTAL OF 150 MG DAILY    vancomycin (VANCOCIN) 125 mg capsule Take one capsule by mouth twice daily for 7 days, THEN one capsule daily for 7 days, THEN one capsule every 48 hours for 14 days. The day after you finish the previous prescription for vancomycin 125 mg po QID, start with this taper.         There were no vitals filed for this visit.  Physical Exam:  Gen:  Alert, well appearing   HEENT:  No scleral icterus  Lungs:  speaks in full sentences  Skin:  No facial rash      Lab/Radiology/Other Diagnostic Tests:  CBC w/Diff    Lab Results   Component Value Date/Time    WBC 13.5 (H) 05/25/2023 01:41 AM    RBC 4.00 05/25/2023 01:41 AM    HGB 11.9 (L) 05/25/2023 01:41 AM    HCT 36.2 05/25/2023 01:41 AM    MCV 90.5 05/25/2023 01:41 AM    MCH 29.8 05/25/2023 01:41 AM    MCHC 32.9 05/25/2023 01:41 AM    RDW 15.3 (H) 05/25/2023 01:41 AM    PLTCT 303 05/25/2023 01:41 AM    MPV 8.1 05/25/2023 01:41 AM    Lab Results   Component Value Date/Time    NEUT 56 05/25/2023 01:41 AM    ANC 7.55 (H) 05/25/2023 01:41 AM    LYMA 32 05/25/2023 01:41 AM    ALC 4.33 05/25/2023 01:41 AM    MONA 6 05/25/2023 01:41 AM    AMC 0.85 (H) 05/25/2023 01:41 AM    EOSA 5 05/25/2023 01:41 AM    AEC 0.60 (H) 05/25/2023 01:41 AM    BASA 1 05/25/2023 01:41 AM    ABC 0.13  05/25/2023 01:41 AM         Comprehensive Metabolic Profile    Lab Results   Component Value Date/Time    NA 139 05/25/2023 01:41 AM    K 3.5 05/25/2023 01:41 AM    CL 107 05/25/2023 01:41 AM    CO2 20 (L) 05/25/2023 01:41 AM    GAP 12 05/25/2023 01:41 AM    BUN 14 05/25/2023 01:41 AM    CR 1.03 (H) 05/25/2023 01:41 AM    GLU 92 05/25/2023 01:41 AM    Lab Results   Component Value Date/Time    CA 9.4 05/25/2023 01:41 AM    PO4 4.2 05/03/2023 04:40 AM    ALBUMIN 4.2 05/25/2023 01:41 AM    TOTPROT 7.6 05/25/2023 01:41 AM    ALKPHOS 30 05/25/2023 01:41 AM    AST 16 05/25/2023 01:41 AM    ALT 16 05/25/2023 01:41 AM    TOTBILI 0.4 05/25/2023 01:41 AM    GFR NA for Peds 06/04/2007 12:49 PM    GFRAA NA for Peds 06/04/2007 12:49 PM                    Assessment / Plan :    Chronic lymphedema  -Following with Fredirick Maudlin, plastic surgery clinic    H/o recurrent RLE cellulitis  -Since 01/2023 has had multiple admissions for cellulitis, predominant symptoms pain and erythema that had improves rapidly. Most recent hospitalization 05/01/23 at that time concern for cellulitis was low and felt more likely to be pain due to lymphedema    C diff  -05/2023 reports being diagnosed with C diff by PCP and treated with PO Vancomycin    DM2  Peripheral neuropathy  -4/2 A1c 6.0  -PTA gabapentin    Right diabetic foot ulcer  -4/2 MRI RLE: Minimal bone marrow edema in the great toe tip, likely reactive.  No evidence of osteomyelitis.  Small cutaneous ulceration along plantar aspect of the lateral forefoot between the fourth and fifth metatarsal heads.  No drainable fluid collection or abscess.  Moderate arthrosis of first MTP joint.  -4/5 Anaerobic culture from right foot ulcer swab grew moderate Finegoldia Magna  -4/26 MRI RLE: Moderate first MTP degenerative change with hallux valgus.  Mild subcutaneous edema throughout the foot, greatest dorsally.  No confluent marrow signal abnormalities to suggest osteomyelitis.  -Follows with Dr. Egbert Garibaldi    Antibiotics intolerances/reactions  -PCN: nausea/emesis, itchy throat  -Cephalexin: itching  -Augmentin: tolerated in 01/2023, 05/2023 chest pain and later itching?   -Tolerated Cefazolin   -05/19/23 penicillin skin testing negative but later noted pruritus w/o rash and nausea    Recommendations/plan  -Previously had planned for suppressive antibiotics given recurrences of cellulitis however has had issues tolerating antibiotics and now has been diagnosed with C diff so will not pursue suppressive therapy as risk outweighs benefit  -Pt reports improvement in leg since starting lymphedema pumps. Discussed focusing on risk factors for cellulitis rather than suppressive therapy given her intolerances and side effects from antibitoics  -Follow up with ID prn     Mamie Nick, MD  Division of Infectious Diseases  Pager [phone]  Please contact via Voalte                           Total Time Today was 20 minutes in the following activities: Preparing to see the patient, Obtaining and/or reviewing separately obtained history, Counseling and educating the patient/family/caregiver, and Documenting  clinical information in the electronic or other health record

## 2023-06-30 ENCOUNTER — Encounter: Admit: 2023-06-30 | Discharge: 2023-06-30 | Payer: BC Managed Care – PPO | Primary: Family

## 2023-06-30 ENCOUNTER — Ambulatory Visit: Admit: 2023-06-30 | Discharge: 2023-07-01 | Payer: BC Managed Care – PPO | Primary: Family

## 2023-06-30 DIAGNOSIS — E119 Type 2 diabetes mellitus without complications: Secondary | ICD-10-CM

## 2023-06-30 DIAGNOSIS — R000 Tachycardia, unspecified: Secondary | ICD-10-CM

## 2023-06-30 DIAGNOSIS — F32A Depression: Secondary | ICD-10-CM

## 2023-06-30 DIAGNOSIS — F419 Anxiety disorder, unspecified: Secondary | ICD-10-CM

## 2023-06-30 DIAGNOSIS — Z8742 Personal history of other diseases of the female genital tract: Secondary | ICD-10-CM

## 2023-06-30 DIAGNOSIS — E11621 Type 2 diabetes mellitus with foot ulcer: Secondary | ICD-10-CM

## 2023-06-30 DIAGNOSIS — R42 Dizziness and giddiness: Secondary | ICD-10-CM

## 2023-06-30 DIAGNOSIS — E7800 Pure hypercholesterolemia, unspecified: Secondary | ICD-10-CM

## 2023-06-30 DIAGNOSIS — T50905A Adverse effect of unspecified drugs, medicaments and biological substances, initial encounter: Secondary | ICD-10-CM

## 2023-06-30 DIAGNOSIS — R079 Chest pain, unspecified: Secondary | ICD-10-CM

## 2023-06-30 DIAGNOSIS — I10 Essential (primary) hypertension: Secondary | ICD-10-CM

## 2023-06-30 DIAGNOSIS — I890 Lymphedema, not elsewhere classified: Secondary | ICD-10-CM

## 2023-06-30 DIAGNOSIS — D539 Nutritional anemia, unspecified: Secondary | ICD-10-CM

## 2023-06-30 DIAGNOSIS — B999 Unspecified infectious disease: Secondary | ICD-10-CM

## 2023-06-30 NOTE — Progress Notes
Name:Samantha Valencia           MRN: [id number]                 DOB:1996/01/21          Age: 27 y.o.  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    family history includes Bipolar Disorder in her mother; Cancer in her maternal grandmother; Depression in her mother; Diabetes in her maternal grandfather; Schizophrenia in her father.    Social History     Socioeconomic History    Marital status: Single   Tobacco Use    Smoking status: Never    Smokeless tobacco: Never   Vaping Use    Vaping status: Never Used   Substance and Sexual Activity    Alcohol use: Never    Drug use: Never    Sexual activity: Yes     Partners: Male     Birth control/protection: Injection       Vaping/E-liquid Use    Vaping Use Never User                   History of Present Illness    Samantha Valencia is a 27 y.o. female. Pt with a history of diabetes presents with wound on bottom of right foot. Pt had been in the hospital for recurrent right lower extremity cellulitis. On initial presentation, wound had thick callus in place but pt stated that she could feel pain under callus. Callus debrided in clinic revealing a fissure present. No signs of infection noted in clinic at that time. Pt was wearing an offloading boot for a majority of the day. Pt with a CGM and last A1C on 01/31/23 was 6.0%. Pt does not smoke. Pt eats a fair amount of protein in her diet. Was regularly wearing compression. Had MRI on 02/24/23 which showed no signs of osteomyelitis. Pt was in hospital in mid-May for cellulitis. Readmitted on 04/05/23 for cellulitis again in her right leg. Pt discharged but noted to have cellulitis again in right leg so admitted to hospital on 04/25/23. Receiving IV antibiotics with plan to transition back to oral medications. Imaging negative. Followed by ID. Continued hydrofera blue and pt followed up in wound clinic. Noted to have some increased drainage but no signs of infection. Stated that offloading boot had worn out but pt using wedge for offloading. Discussed additional forms of offloading and pt stated that she would try crutches. Due to increased drainage, started Aquacel Ag dressing. Pt had further pain and redness in foot so went to ER and was given antibiotics. Eventually contracted C Diff and required po Vanc for treatment. Wound is clean but not improving. Some callus noted around wound which was trimmed  in clinic.     Review of Systems   Skin:  Positive for wound.      Objective:          acetaminophen (TYLENOL) 325 mg tablet Take two tablets by mouth every 4 hours as needed. Indications: pain    atorvastatin (LIPITOR) 10 mg tablet Take one tablet by mouth daily.    cholecalciferol (vitamin D3) (VITAMIN D3 PO) Take 2 Gummy by mouth daily. 2 gummies = 2000 IU    ferrous sulfate (FEOSOL) 325 mg (65 mg iron) tablet Take one tablet by mouth three times weekly. Take on an empty stomach at least 1 hour before or 2 hours after food.  Indications: anemia from inadequate iron    FLORAJEN DIGESTION 15 billion cell cap Take 1 capsule by mouth every morning.    fluoxetine (PROZAC) 40 mg capsule Take one capsule by mouth at bedtime daily.    furosemide (LASIX) 20 mg tablet Take one tablet by mouth every morning. Indications: visible water retention    gabapentin (NEURONTIN) 100 mg capsule Take one capsule by mouth at bedtime daily.    hydroCHLOROthiazide (HYDRODIURIL) 25 mg tablet Take one tablet by mouth every morning.    hydrOXYzine HCL (ATARAX) 25 mg tablet Take one tablet by mouth every morning. Indications: anxious    ibuprofen (MOTRIN) 800 mg tablet Take one tablet by mouth every 8 hours as needed for Pain. Take with food.  Indications: pain    insulin glargine(+) (LANTUS U-100 INSULIN) 100 unit/mL subcutaneous solution Inject fifteen Units under the skin at bedtime daily. Indications: type 2 diabetes mellitus    lidocaine (LIDODERM) 5 % topical patch Apply one patch topically to affected area daily. Apply patch for 12 hours, then remove for 12 hours before repeating.  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    magnesium oxide (MAGOX) 400 mg (241.3 mg magnesium) tablet Take one tablet by mouth twice daily.    medroxyPROGESTERone (DEPO-PROVERA) 150 mg/mL injection Inject 1 mL into the muscle every 90 days.    melatonin 5 mg chew Chew 3 Gummy by mouth at bedtime as needed.    metFORMIN (GLUCOPHAGE) 1,000 mg tablet Take one tablet by mouth twice daily with meals.    Miscellaneous Medical Supply misc 1 set of crutches    Miscellaneous Medical Supply misc Offloading boot for right foot ulceration    montelukast (SINGULAIR) 10 mg tablet Take one tablet by mouth at bedtime daily.    other medication Medication Name & Strength: AZO Yeast Plus    Dose(how many): 1 tablet    Frequency(how often): three times daily as needed    oxyCODONE (ROXICODONE) 5 mg tablet Take one tablet by mouth every 4 hours as needed for Pain.    oxyCODONE (ROXICODONE) 5 mg tablet Take one tablet by mouth every 4 hours as needed. Indications: pain    OZEMPIC 2 mg/dose (8 mg/3 mL) injection PEN Inject two mg under the skin every 7 days. Sundays    PROAIR HFA 90 mcg/actuation inhaler Inhale two puffs by mouth into the lungs every 4 hours as needed.    prochlorperazine maleate (COMPAZINE) 10 mg tablet Take one tablet by mouth every 8 hours as needed for Other... (take with benadryl as needed for migraine headache).    riboflavin (vitamin B2) 400 mg tablet Take one tablet by mouth daily.    SUMAtriptan succinate (IMITREX) 100 mg tablet  Take one tablet by mouth at onset of headache. May repeat after 2 hours if needed. Max of 200 mg in 24 hours.  Indications: a migraine headache, DO NOT TAKE THIS MEDICINE WHILE YOU ARE ON LINEZOLID    tiZANidine (ZANAFLEX) 4 mg tablet Take one-half tablet by mouth daily as needed.    topiramate (TOPAMAX) 50 mg tablet TAKE 1 TABLET BY MOUTH IN THE MORNING AND 2 AT NIGHT FOR TOTAL OF 150 MG DAILY    vancomycin (VANCOCIN) 125 mg capsule Take one capsule by mouth twice daily for 7 days, THEN one capsule daily for 7 days, THEN one capsule every 48 hours for 14 days. The day after you finish the previous prescription for vancomycin 125 mg po QID, start with this taper.     Vitals:    06/30/23 0845   BP: 133/69   BP Source: Arm, Right Upper   Pulse: 74   Temp: 36.8 ?C (98.2 ?F)   Resp: 20   SpO2: 100%   TempSrc: Oral   PainSc: Zero     There is no height or weight on file to calculate BMI.   Physical Exam  Constitutional:       Appearance: Normal appearance.   Eyes:      Conjunctiva/sclera: Conjunctivae normal.      Pupils: Pupils are equal, round, and reactive to light.   Cardiovascular:      Rate and Rhythm: Normal rate.   Pulmonary:      Effort: Pulmonary effort is normal.   Musculoskeletal:         General: Normal range of motion.      Cervical back: Normal range of motion.   Skin:     General: Skin is warm and dry.   Neurological:      Mental Status: She is alert. Mental status is at baseline.   Psychiatric:         Mood and Affect: Mood normal.         Thought Content: Thought content normal.           Wounds Pressure injury Right;Plantar (Active)   03/07/23 0935   Wound Type: Pressure injury   Orientation: Right;Plantar   Location:    Wound Location Comments:    Initial Wound Site Closure:    Initial Dressing Placed:    Initial Cycle:    Initial Suction Setting (mmHg):    Pressure Injury Stages:    Pressure Injury Present Within 24 Hours of Hospital Admission:    If This Pressure Injury Is Suspected to Be Device Related, Please Select the Device::    Is the Wound Open or Closed:    Image   06/30/23 0800   Wound Assessment Moist;Red 06/30/23 0800   Peri-wound Assessment Callous 06/30/23 0800   Wound Drainage Amount Moderate 06/30/23 0800   Wound Drainage Description Serosanguineous 06/30/23 0800   Wound Dressing Status Intact 05/26/23 0900   Wound Care Dressing changed or new application 06/30/23 0800   Wound Dressing and/or Treatment Aquacel AG;Dry gauze;Hypafix tape 05/26/23 0900   Wound Status (Wound Team Only) Being Treated 06/30/23 0800   Wound Length (cm) (Wound Team Only) 0.9 cm 06/30/23 0800   Wound Width (cm) (Wound Team Only) 0.6 cm 06/30/23 0800   Wound Depth (cm) (Wound Team Only) 0.1 cm 06/30/23 0800   Wound Surface Area (cm^2) (Wound Team Only) 0.54 cm^2 06/30/23 0800   Wound Volume (cm^3) (Wound Team Only) 0.054 cm^3 06/30/23 0800   Wound Healing % (  Wound Team Only) -125 06/30/23 0800   Undermining in CM (Wound Team Only) 0 cm 06/30/23 0800   Tunneling in CM (Wound Team Only) 0 cm 06/30/23 0800   Number of days: 115                       ALL wounds debrided using: sharp     Assessment and Plan:  Diabetic foot ulcer, right plantar foot, 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)  - Aquacel Ag dressing, cover with ABD, secure with hypafix, change daily  - Keep clean and dry  - Monitor and report any signs of infection  - Continue antibiotics per ID  - Post-op shoe for offloading, order for replacement offloading boot  - Compression daily  - Elevate feet higher than level of heart at least 20 minutes twice a day  - Maintain adequate glucose levels  - Follow-up in 3 weeks      - Will begin process for scheduling HBO treatments  -- Plan for 40x HBO sessions at 2.5 ATA with 2 scheduled air breaks    Total Time Today was 20 minutes in the following activities: Preparing to see the patient, Performing a medically appropriate examination and/or evaluation, Counseling and educating the patient/family/caregiver, and Documenting clinical information in the electronic or other health record

## 2023-07-01 DIAGNOSIS — L97412 Non-pressure chronic ulcer of right heel and midfoot with fat layer exposed: Secondary | ICD-10-CM

## 2023-07-06 ENCOUNTER — Encounter: Admit: 2023-07-06 | Discharge: 2023-07-06 | Payer: BC Managed Care – PPO | Primary: Family

## 2023-07-06 ENCOUNTER — Ambulatory Visit: Admit: 2023-07-06 | Discharge: 2023-07-07 | Payer: BC Managed Care – PPO | Primary: Family

## 2023-07-06 DIAGNOSIS — R000 Tachycardia, unspecified: Secondary | ICD-10-CM

## 2023-07-06 DIAGNOSIS — B999 Unspecified infectious disease: Secondary | ICD-10-CM

## 2023-07-06 DIAGNOSIS — D539 Nutritional anemia, unspecified: Secondary | ICD-10-CM

## 2023-07-06 DIAGNOSIS — E7800 Pure hypercholesterolemia, unspecified: Secondary | ICD-10-CM

## 2023-07-06 DIAGNOSIS — Z8742 Personal history of other diseases of the female genital tract: Secondary | ICD-10-CM

## 2023-07-06 DIAGNOSIS — E11621 Type 2 diabetes mellitus with foot ulcer: Secondary | ICD-10-CM

## 2023-07-06 DIAGNOSIS — F32A Depression: Secondary | ICD-10-CM

## 2023-07-06 DIAGNOSIS — I10 Essential (primary) hypertension: Secondary | ICD-10-CM

## 2023-07-06 DIAGNOSIS — T50905A Adverse effect of unspecified drugs, medicaments and biological substances, initial encounter: Secondary | ICD-10-CM

## 2023-07-06 DIAGNOSIS — R609 Edema, unspecified: Secondary | ICD-10-CM

## 2023-07-06 DIAGNOSIS — I890 Lymphedema, not elsewhere classified: Secondary | ICD-10-CM

## 2023-07-06 DIAGNOSIS — R079 Chest pain, unspecified: Secondary | ICD-10-CM

## 2023-07-06 DIAGNOSIS — R42 Dizziness and giddiness: Secondary | ICD-10-CM

## 2023-07-06 DIAGNOSIS — E119 Type 2 diabetes mellitus without complications: Secondary | ICD-10-CM

## 2023-07-06 DIAGNOSIS — F419 Anxiety disorder, unspecified: Secondary | ICD-10-CM

## 2023-07-06 NOTE — Progress Notes
Date of Service: 07/06/2023    Subjective:             Samantha Valencia is a 27 y.o. female.    History of Present Illness  This is a 27 yo F referred by Dr. Adrian Saran for evaluation of lymphedema after recent hospital admission.    Starting HBOT in October for DFU  Has pumps from [employer] - loves them! Using almost daily. Feels that they have been very helpful  Consistently wearing compression and doing great  Started PO vanc for C diff, off suppressive abx now for time being  Recently obtained vibration plate and has just started using this        HPI:    Her PMH includes DM II, lymphedema, R DFU, HLD, depression, anxiety. She has had multiple recent hospitalizations for R DFU with recurrent RLE cellulitis, most recently admitted to [facility] on 05/01/23. ID had placed her on suppressive PO Augmentin 875 BID but stopped due to chest pain, following up with allergy/immunology for skin testing and pcn challenge - negative but had pruritus and nausea. Following up with ID ([facility]) as outpatient.   Had MRI on 03/14/23 which showed no signs of osteomyelitis.   Has seen Dr. Egbert Garibaldi recently in wound clinic, treating with Aquacel and offloading boot ordered.    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    Metformin, Ozempic  Continues to lose weight     She has seen Dr. Nash Shearer in [facility]   Echo with EF 61% and no diastolic dysfunction  Exercise stress test pending after ECG done in hospital demonstrated T wave abnormalities and nonspecific ST wave changes  She does report ongoing SOB with rest and exertion    CT pelvis with contrast 04/25/23  IMPRESSION   1. No right inguinal/thigh mass or fluid collection to correlate with   lesion seen on recent ultrasound.      2.  Upper limits of normal sized right inguinal lymph nodes, likely   reactive.     US Doppler Venous RLE 04/25/23  1.  No deep vein thrombus in the right lower extremity.   2.  Right inguinal lymphadenopathy. This is likely reactive and/or   suppurative adenopathy secondary to patient's known cellulitis and right   foot wound, however lymphoproliferative or metastatic disease could appear   similar.   3. Ill-defined hypoechoic mass in the right groin/upper medial leg is   indeterminate and may represent an additional suppurative lymph node or   abscess. Fluid sampling and/or CT of the right lower extremity with   contrast could be obtained for further evaluation.     Echo 03/15/23   The left ventricular size is normal. The volume measurements of the left ventricle are mildly increased. The left ventricular wall  thickness is normal. Normal geometry. The left ventricular systolic function is normal. The ejection fraction by Simpson's biplane method is 61%. There are no segmental wall motion abnormalities. Normal left ventricular diastolic function. Normal left atrial pressure.    The right ventricular size is normal. The right ventricular systolic function is normal. The pulmonary artery pressure could not be estimated due to inadequate tricuspid regurgitation signal.    Biatrial findings are grossly normal.    No significant valvular abnormalities identified.    The aortic root and ascending aorta are normal in size.    No pericardial effusion.      Has had RLE arterial duplex but no ABI's                   Objective:         acetaminophen (TYLENOL) 325 mg tablet Take two tablets by mouth every 4 hours as needed. Indications: pain    atorvastatin (LIPITOR) 10 mg tablet Take one tablet by mouth daily.    cholecalciferol (vitamin D3) (VITAMIN D3 PO) Take 2 Gummy by mouth daily. 2 gummies = 2000 IU    ferrous sulfate (FEOSOL) 325 mg (65 mg iron) tablet Take one tablet by mouth three times weekly. Take on an empty stomach at least 1 hour before or 2 hours after food.  Indications: anemia from inadequate iron    FLORAJEN DIGESTION 15 billion cell cap Take 1 capsule by mouth every morning.    fluoxetine (PROZAC) 40 mg capsule Take one capsule by mouth at bedtime daily.    furosemide (LASIX) 20 mg tablet Take one tablet by mouth every morning. Indications: visible water retention    gabapentin (NEURONTIN) 100 mg capsule Take one capsule by mouth at bedtime daily.    hydroCHLOROthiazide (HYDRODIURIL) 25 mg tablet Take one tablet by mouth every morning.    hydrOXYzine HCL (ATARAX) 25 mg tablet Take one tablet by mouth every morning. Indications: anxious    ibuprofen (MOTRIN) 800 mg tablet Take one tablet by mouth every 8 hours as needed for Pain. Take with food.  Indications: pain    insulin glargine(+) (LANTUS U-100 INSULIN) 100 unit/mL subcutaneous solution Inject fifteen Units under the skin at bedtime daily. Indications: type 2 diabetes mellitus    lidocaine (LIDODERM) 5 % topical patch Apply one patch topically to affected area daily. Apply patch for 12 hours, then remove for 12 hours before repeating.  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    magnesium oxide (MAGOX) 400 mg (241.3 mg magnesium) tablet Take one tablet by mouth twice daily.    medroxyPROGESTERone (DEPO-PROVERA) 150 mg/mL injection Inject 1 mL into the muscle every 90 days.    melatonin 5 mg chew Chew 3 Gummy by mouth at bedtime as needed. metFORMIN (GLUCOPHAGE) 1,000 mg tablet Take one tablet by mouth twice daily with meals.    Miscellaneous Medical Supply misc 1 set of crutches    Miscellaneous Medical Supply misc Offloading boot for right foot ulceration    montelukast (SINGULAIR) 10 mg tablet Take one tablet by mouth at bedtime daily.    other medication Medication Name & Strength: AZO Yeast Plus    Dose(how many): 1 tablet    Frequency(how often): three times daily as needed    oxyCODONE (ROXICODONE) 5 mg tablet Take one tablet by mouth every 4 hours as needed for Pain.    oxyCODONE (ROXICODONE) 5 mg  tablet Take one tablet by mouth every 4 hours as needed. Indications: pain (Patient not taking: Reported on 07/06/2023)    OZEMPIC 2 mg/dose (8 mg/3 mL) injection PEN Inject two mg under the skin every 7 days. Sundays    PROAIR HFA 90 mcg/actuation inhaler Inhale two puffs by mouth into the lungs every 4 hours as needed.    prochlorperazine maleate (COMPAZINE) 10 mg tablet Take one tablet by mouth every 8 hours as needed for Other... (take with benadryl as needed for migraine headache).    riboflavin (vitamin B2) 400 mg tablet Take one tablet by mouth daily.    SUMAtriptan succinate (IMITREX) 100 mg tablet Take one tablet by mouth at onset of headache. May repeat after 2 hours if needed. Max of 200 mg in 24 hours.  Indications: a migraine headache, DO NOT TAKE THIS MEDICINE WHILE YOU ARE ON LINEZOLID    tiZANidine (ZANAFLEX) 4 mg tablet Take one-half tablet by mouth daily as needed.    topiramate (TOPAMAX) 50 mg tablet TAKE 1 TABLET BY MOUTH IN THE MORNING AND 2 AT NIGHT FOR TOTAL OF 150 MG DAILY    vancomycin (VANCOCIN) 125 mg capsule Take one capsule by mouth twice daily for 7 days, THEN one capsule daily for 7 days, THEN one capsule every 48 hours for 14 days. The day after you finish the previous prescription for vancomycin 125 mg po QID, start with this taper.     Vitals:    07/06/23 0844   BP: 133/85   Temp: 36.6 ?C (97.9 ?F)   PainSc: Four Height: 172.7 cm (5' 8)     Body mass index is 42.57 kg/m?Marland Kitchen     Physical Exam  Vitals reviewed.   Constitutional:       General: She is not in acute distress.  HENT:      Head: Normocephalic and atraumatic.   Eyes:      Conjunctiva/sclera: Conjunctivae normal.   Cardiovascular:      Comments: Unable to palpate pedal pulses through edema  Pulmonary:      Effort: Pulmonary effort is normal.   Musculoskeletal:      Right lower leg: Edema present.      Left lower leg: Edema present.      Comments: There is bilateral and symmetric lymphedema of bilateral lower extremities  Prominent dorsal foot swelling, positive Stemmer sign  Mild ankle cuff sign  Tissue throughout lower legs and thighs is tender on palpation   Skin:     Comments: R plantar foot with ulceration over 1st MTPJ, dressing intact   No erythema     Neurological:      General: No focal deficit present.      Mental Status: She is alert and oriented to person, place, and time.   Psychiatric:         Mood and Affect: Mood normal.         Thought Content: Thought content normal.              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

## 2023-07-11 ENCOUNTER — Encounter: Admit: 2023-07-11 | Discharge: 2023-07-11 | Payer: BC Managed Care – PPO | Primary: Family

## 2023-07-24 ENCOUNTER — Encounter: Admit: 2023-07-24 | Discharge: 2023-07-24 | Payer: BC Managed Care – PPO | Primary: Family

## 2023-07-25 ENCOUNTER — Encounter: Admit: 2023-07-25 | Discharge: 2023-07-25 | Payer: BC Managed Care – PPO | Primary: Family

## 2023-07-25 NOTE — Telephone Encounter
Called patient to confirm PCN testing for 9/30 8am; no answer so LVM to call me back.

## 2023-07-27 ENCOUNTER — Encounter: Admit: 2023-07-27 | Discharge: 2023-07-27 | Payer: BC Managed Care – PPO | Primary: Family

## 2023-07-28 ENCOUNTER — Encounter: Admit: 2023-07-28 | Discharge: 2023-07-28 | Payer: BC Managed Care – PPO | Primary: Family

## 2023-07-28 ENCOUNTER — Ambulatory Visit: Admit: 2023-07-28 | Discharge: 2023-07-28 | Payer: BC Managed Care – PPO | Primary: Family

## 2023-07-28 ENCOUNTER — Ambulatory Visit: Admit: 2023-07-28 | Discharge: 2023-07-29 | Payer: BC Managed Care – PPO | Primary: Family

## 2023-07-28 DIAGNOSIS — F32A Depression: Secondary | ICD-10-CM

## 2023-07-28 DIAGNOSIS — E119 Type 2 diabetes mellitus without complications: Secondary | ICD-10-CM

## 2023-07-28 DIAGNOSIS — T50905A Adverse effect of unspecified drugs, medicaments and biological substances, initial encounter: Secondary | ICD-10-CM

## 2023-07-28 DIAGNOSIS — R000 Tachycardia, unspecified: Secondary | ICD-10-CM

## 2023-07-28 DIAGNOSIS — R42 Dizziness and giddiness: Secondary | ICD-10-CM

## 2023-07-28 DIAGNOSIS — D539 Nutritional anemia, unspecified: Secondary | ICD-10-CM

## 2023-07-28 DIAGNOSIS — I890 Lymphedema, not elsewhere classified: Secondary | ICD-10-CM

## 2023-07-28 DIAGNOSIS — Z8742 Personal history of other diseases of the female genital tract: Secondary | ICD-10-CM

## 2023-07-28 DIAGNOSIS — B999 Unspecified infectious disease: Secondary | ICD-10-CM

## 2023-07-28 DIAGNOSIS — E11621 Type 2 diabetes mellitus with foot ulcer: Secondary | ICD-10-CM

## 2023-07-28 DIAGNOSIS — E7800 Pure hypercholesterolemia, unspecified: Secondary | ICD-10-CM

## 2023-07-28 DIAGNOSIS — R079 Chest pain, unspecified: Secondary | ICD-10-CM

## 2023-07-28 DIAGNOSIS — F419 Anxiety disorder, unspecified: Secondary | ICD-10-CM

## 2023-07-28 DIAGNOSIS — I10 Essential (primary) hypertension: Secondary | ICD-10-CM

## 2023-07-28 NOTE — Progress Notes
Name:Samantha Valencia           MRN: [id number]                 DOB:05-24-1996          Age: 27 y.o.  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    family history includes Bipolar Disorder in her mother; Cancer in her maternal grandmother; Depression in her mother; Diabetes in her maternal grandfather; Schizophrenia in her father.    Social History     Socioeconomic History    Marital status: Single   Tobacco Use    Smoking status: Never    Smokeless tobacco: Never   Vaping Use    Vaping status: Never Used   Substance and Sexual Activity    Alcohol use: Never    Drug use: Never    Sexual activity: Yes     Partners: Male     Birth control/protection: Injection       Vaping/E-liquid Use    Vaping Use Never User                   History of Present Illness    Samantha Valencia is a 27 y.o. female. Pt with a history of diabetes presents with wound on bottom of right foot. Pt had been in the hospital for recurrent right lower extremity cellulitis. On initial presentation, wound had thick callus in place but pt stated that she could feel pain under callus. Callus debrided in clinic revealing a fissure present. No signs of infection noted in clinic at that time. Pt was wearing an offloading boot for a majority of the day. Pt with a CGM and last A1C on 01/31/23 was 6.0%. Pt does not smoke. Pt eats a fair amount of protein in her diet. Was regularly wearing compression. Had MRI on 02/24/23 which showed no signs of osteomyelitis. Pt was in hospital in mid-May for cellulitis. Readmitted on 04/05/23 for cellulitis again in her right leg. Pt discharged but noted to have cellulitis again in right leg so admitted to hospital on 04/25/23. Receiving IV antibiotics with plan to transition back to oral medications. Imaging negative. Followed by ID. Continued hydrofera blue and pt followed up in wound clinic. Noted to have some increased drainage but no signs of infection. Stated that offloading boot had worn out but pt using wedge for offloading. Discussed additional forms of offloading and pt stated that she would try crutches. Due to increased drainage, started Aquacel Ag dressing. Pt had further pain and redness in foot so went to ER and was given antibiotics. Eventually contracted C Diff and required po Vanc for treatment. Wound is clean but not improving. Some callus noted around wound  which was trimmed in clinic. Pt noted continued drainage which sometimes would seep through dressing. Pt also had complaints of pain in third, fourth, and fifth toes but no clinical signs of infection were noted at that time.    Pt was consulted for hyperbaric treatments. 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 However, after multiple discussions with hyperbaric director, pt wound was deemed to be to shallow to be considered a Wagner 3 and thus I was informed that we would not be proceeding with hyperbaric treatments at [facility]. Informed pt of this decision and advised that she consider looking at other locations for possible treatments. Pt was able to get an in with [facility] in [facility] for a consult as soon as notes are faxed.    Review of Systems   Skin:  Positive for wound.      Objective:          acetaminophen (TYLENOL) 325 mg tablet Take two tablets by mouth every 4 hours as needed. Indications: pain    atorvastatin (LIPITOR) 10 mg tablet Take one tablet by mouth daily.    cholecalciferol (vitamin D3) (VITAMIN D3 PO) Take 2 Gummy by mouth daily. 2 gummies = 2000 IU    ferrous sulfate (FEOSOL) 325 mg (65 mg iron) tablet Take one tablet by mouth three times weekly. Take on an empty stomach at least 1 hour before or 2 hours after food.  Indications: anemia from inadequate iron    FLORAJEN DIGESTION 15 billion cell cap Take 1 capsule by mouth every morning.    fluoxetine (PROZAC) 40 mg capsule Take one capsule by mouth at bedtime daily.    furosemide (LASIX) 20 mg tablet Take one tablet by mouth every morning. Indications: visible water retention    gabapentin (NEURONTIN) 100 mg capsule Take one capsule by mouth at bedtime daily.    hydroCHLOROthiazide (HYDRODIURIL) 25 mg tablet Take one tablet by mouth every morning.    hydrOXYzine HCL (ATARAX) 25 mg tablet Take one tablet by mouth every morning. Indications: anxious    ibuprofen (MOTRIN) 800 mg tablet Take one tablet by mouth every 8 hours as needed for Pain. Take with food.  Indications: pain    insulin glargine(+) (LANTUS U-100 INSULIN) 100 unit/mL subcutaneous solution Inject fifteen Units under the skin at bedtime daily. Indications: type 2 diabetes mellitus    lidocaine (LIDODERM) 5 % topical patch Apply one patch topically to affected area daily. Apply patch for 12 hours, then remove for 12 hours before repeating.  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    magnesium oxide (MAGOX) 400 mg (241.3 mg magnesium) tablet Take one tablet by mouth twice daily.    medroxyPROGESTERone (DEPO-PROVERA) 150 mg/mL injection Inject 1 mL into the muscle every 90 days.    melatonin 5 mg chew Chew 3 Gummy by mouth at bedtime as needed.    metFORMIN (GLUCOPHAGE) 1,000 mg tablet Take one tablet by mouth twice daily with meals.    Miscellaneous Medical Supply misc 1 set of crutches    Miscellaneous Medical Supply misc Offloading boot for right foot ulceration    montelukast (SINGULAIR) 10 mg tablet Take one tablet by mouth at bedtime daily.    other medication Medication Name & Strength: AZO Yeast Plus    Dose(how many): 1 tablet  Frequency(how often): three times daily as needed    oxyCODONE (ROXICODONE) 5 mg tablet Take one tablet by mouth every 4 hours as needed for Pain.    oxyCODONE (ROXICODONE) 5 mg tablet Take one tablet by mouth every 4 hours as needed. Indications: pain    OZEMPIC 2 mg/dose (8 mg/3 mL) injection PEN Inject two mg under the skin every 7 days. Sundays    PROAIR HFA 90 mcg/actuation inhaler Inhale two puffs by mouth into the lungs every 4 hours as needed.    prochlorperazine maleate (COMPAZINE) 10 mg tablet Take one tablet by mouth every 8 hours as needed for Other... (take with benadryl as needed for migraine headache).    riboflavin (vitamin B2) 400 mg tablet Take one tablet by mouth daily.    SUMAtriptan succinate (IMITREX) 100 mg tablet Take one tablet by mouth at onset of headache. May repeat after 2 hours if needed. Max of 200 mg in 24 hours.  Indications: a migraine headache, DO NOT TAKE THIS MEDICINE WHILE YOU ARE ON LINEZOLID    tiZANidine (ZANAFLEX) 4 mg tablet Take one-half tablet by mouth daily as needed.    topiramate (TOPAMAX) 50 mg tablet TAKE 1 TABLET BY MOUTH IN THE MORNING AND 2 AT NIGHT FOR TOTAL OF 150 MG DAILY     Vitals:    07/28/23 0906   BP: 124/70   Pulse: 80   Temp: 36.8 ?C (98.3 ?F)   SpO2: 99%   PainSc: Four     There is no height or weight on file to calculate BMI.   Physical Exam  Constitutional:       Appearance: Normal appearance.   Eyes:      Conjunctiva/sclera: Conjunctivae normal.      Pupils: Pupils are equal, round, and reactive to light.   Cardiovascular:      Rate and Rhythm: Normal rate.   Pulmonary:      Effort: Pulmonary effort is normal.   Musculoskeletal:         General: Normal range of motion.      Cervical back: Normal range of motion.   Skin:     General: Skin is warm and dry. Neurological:      Mental Status: She is alert. Mental status is at baseline.   Psychiatric:         Mood and Affect: Mood normal.         Thought Content: Thought content normal.           Wounds Pressure injury Right;Plantar (Active)   03/07/23 0935   Wound Type: Pressure injury   Orientation: Right;Plantar   Location:    Wound Location Comments:    Initial Wound Site Closure:    Initial Dressing Placed:    Initial Cycle:    Initial Suction Setting (mmHg):    Pressure Injury Stages:    Pressure Injury Present Within 24 Hours of Hospital Admission:    If This Pressure Injury Is Suspected to Be Device Related, Please Select the Device::    Is the Wound Open or Closed:    Image   07/28/23 0900   Wound Assessment Moist;Red;Pale 07/28/23 0900   Peri-wound Assessment Callous 07/28/23 0900   Wound Drainage Amount Moderate 07/28/23 0900   Wound Drainage Description Serosanguineous 07/28/23 0900   Wound Dressing Status Intact 05/26/23 0900   Wound Care Dressing changed or new application 07/28/23 0900   Wound Dressing and/or Treatment Aquacel AG;Hypafix tape 07/28/23 0900   Wound Status (  Wound Team Only) Being Treated 07/28/23 0900   Wound Length (cm) (Wound Team Only) 1 cm 07/28/23 0900   Wound Width (cm) (Wound Team Only) 0.7 cm 07/28/23 0900   Wound Depth (cm) (Wound Team Only) 0.2 cm 07/28/23 0900   Wound Surface Area (cm^2) (Wound Team Only) 0.7 cm^2 07/28/23 0900   Wound Volume (cm^3) (Wound Team Only) 0.14 cm^3 07/28/23 0900   Wound Healing % (Wound Team Only) -483.33 07/28/23 0900   Undermining in CM (Wound Team Only) 0 cm 06/30/23 0800   Tunneling in CM (Wound Team Only) 0 cm 06/30/23 0800   Number of days: 143                       ALL wounds debrided using: sharp     Assessment and Plan:  Diabetic foot ulcer, right plantar foot, 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)  - Qwick dressing, cover with ABD, secure with hypafix, change daily  - Keep clean and dry  - Monitor and report any signs of infection  - Offloading boot  - Compression daily  - Elevate feet higher than level of heart at least 20 minutes twice a day  - Maintain adequate glucose levels  - Xray today  - [facility] for Hyperbaric treatments  - Follow-up in 2 weeks     Total Time Today was 30 minutes in the following activities: Preparing to see the patient, Performing a medically appropriate examination and/or evaluation, Counseling and educating the patient/family/caregiver, Ordering medications, tests, or procedures, Referring and communication with other health care professionals (when not separately reported), Documenting clinical information in the electronic or other health record, Independently interpreting results (not separately reported) and communicating results to the patient/family/caregiver, and Care coordination (not separately reported)

## 2023-08-01 ENCOUNTER — Ambulatory Visit: Admit: 2023-08-01 | Discharge: 2023-08-02 | Payer: BC Managed Care – PPO | Primary: Family

## 2023-08-01 ENCOUNTER — Encounter: Admit: 2023-08-01 | Discharge: 2023-08-01 | Payer: BC Managed Care – PPO | Primary: Family

## 2023-08-01 DIAGNOSIS — I890 Lymphedema, not elsewhere classified: Secondary | ICD-10-CM

## 2023-08-01 DIAGNOSIS — F32A Depression: Secondary | ICD-10-CM

## 2023-08-01 DIAGNOSIS — I10 Essential (primary) hypertension: Secondary | ICD-10-CM

## 2023-08-01 DIAGNOSIS — D539 Nutritional anemia, unspecified: Secondary | ICD-10-CM

## 2023-08-01 DIAGNOSIS — B999 Unspecified infectious disease: Secondary | ICD-10-CM

## 2023-08-01 DIAGNOSIS — F419 Anxiety disorder, unspecified: Secondary | ICD-10-CM

## 2023-08-01 DIAGNOSIS — R079 Chest pain, unspecified: Secondary | ICD-10-CM

## 2023-08-01 DIAGNOSIS — E7800 Pure hypercholesterolemia, unspecified: Secondary | ICD-10-CM

## 2023-08-01 DIAGNOSIS — T50905A Adverse effect of unspecified drugs, medicaments and biological substances, initial encounter: Secondary | ICD-10-CM

## 2023-08-01 DIAGNOSIS — Z8742 Personal history of other diseases of the female genital tract: Secondary | ICD-10-CM

## 2023-08-01 DIAGNOSIS — R000 Tachycardia, unspecified: Secondary | ICD-10-CM

## 2023-08-01 DIAGNOSIS — R42 Dizziness and giddiness: Secondary | ICD-10-CM

## 2023-08-01 DIAGNOSIS — E119 Type 2 diabetes mellitus without complications: Secondary | ICD-10-CM

## 2023-08-01 MED ORDER — EMPAGLIFLOZIN 25 MG PO TAB
25 mg | ORAL_TABLET | Freq: Every day | ORAL | 11 refills | Status: AC
Start: 2023-08-01 — End: ?

## 2023-08-02 ENCOUNTER — Encounter: Admit: 2023-08-02 | Discharge: 2023-08-02 | Payer: BC Managed Care – PPO | Primary: Family

## 2023-08-02 DIAGNOSIS — Z794 Long term (current) use of insulin: Secondary | ICD-10-CM

## 2023-08-02 DIAGNOSIS — E1165 Type 2 diabetes mellitus with hyperglycemia: Secondary | ICD-10-CM

## 2023-08-04 ENCOUNTER — Encounter: Admit: 2023-08-04 | Discharge: 2023-08-04 | Payer: BC Managed Care – PPO | Primary: Family

## 2023-08-04 NOTE — Telephone Encounter
RN called to request paperwork for DM shoes  Morrie Sheldon will fax shoe paperwork to fill out

## 2023-08-08 ENCOUNTER — Encounter: Admit: 2023-08-08 | Discharge: 2023-08-08 | Payer: BC Managed Care – PPO | Primary: Family

## 2023-08-08 NOTE — Telephone Encounter
RN received fax for [employer] filled out order and put in provider folder to sign

## 2023-08-09 ENCOUNTER — Encounter: Admit: 2023-08-09 | Discharge: 2023-08-09 | Payer: BC Managed Care – PPO | Primary: Family

## 2023-08-11 ENCOUNTER — Ambulatory Visit: Admit: 2023-08-11 | Discharge: 2023-08-12 | Payer: BC Managed Care – PPO | Primary: Family

## 2023-08-11 ENCOUNTER — Encounter: Admit: 2023-08-11 | Discharge: 2023-08-11 | Payer: BC Managed Care – PPO | Primary: Family

## 2023-08-11 DIAGNOSIS — F32A Depression: Secondary | ICD-10-CM

## 2023-08-11 DIAGNOSIS — E7800 Pure hypercholesterolemia, unspecified: Secondary | ICD-10-CM

## 2023-08-11 DIAGNOSIS — F419 Anxiety disorder, unspecified: Secondary | ICD-10-CM

## 2023-08-11 DIAGNOSIS — I890 Lymphedema, not elsewhere classified: Secondary | ICD-10-CM

## 2023-08-11 DIAGNOSIS — R42 Dizziness and giddiness: Secondary | ICD-10-CM

## 2023-08-11 DIAGNOSIS — E119 Type 2 diabetes mellitus without complications: Secondary | ICD-10-CM

## 2023-08-11 DIAGNOSIS — R000 Tachycardia, unspecified: Secondary | ICD-10-CM

## 2023-08-11 DIAGNOSIS — Z8742 Personal history of other diseases of the female genital tract: Secondary | ICD-10-CM

## 2023-08-11 DIAGNOSIS — B999 Unspecified infectious disease: Secondary | ICD-10-CM

## 2023-08-11 DIAGNOSIS — R079 Chest pain, unspecified: Secondary | ICD-10-CM

## 2023-08-11 DIAGNOSIS — D539 Nutritional anemia, unspecified: Secondary | ICD-10-CM

## 2023-08-11 DIAGNOSIS — T50905A Adverse effect of unspecified drugs, medicaments and biological substances, initial encounter: Secondary | ICD-10-CM

## 2023-08-11 DIAGNOSIS — I10 Essential (primary) hypertension: Secondary | ICD-10-CM

## 2023-08-11 NOTE — Progress Notes
Name:Samantha Valencia           MRN: [id number]                 DOB:04-Jan-1996          Age: 27 y.o.  Date of Service: 08/11/2023    Subjective:            Past Medical History:   Diagnosis Date    Adverse drug reaction     allergy to pennicillin    Anxiety     Chest pain within laat year    Depression     Diabetes (HCC)     Dizziness within last year    change of position    High cholesterol 03/01/23    starting statin med 04/12/23    History of PCOS     Hypertension     Infection     Lymphedema     Tachyarrhythmia     Unspecified deficiency anemia 01/30/23       No past surgical history on file.    family history includes Bipolar Disorder in her mother; Cancer in her maternal grandmother; Depression in her mother; Diabetes in her maternal grandfather; Schizophrenia in her father.    Social History     Socioeconomic History    Marital status: Single   Tobacco Use    Smoking status: Never    Smokeless tobacco: Never   Vaping Use    Vaping status: Never Used   Substance and Sexual Activity    Alcohol use: Never    Drug use: Never    Sexual activity: Yes     Partners: Male     Birth control/protection: Injection       Vaping/E-liquid Use    Vaping Use Never User                   History of Present Illness    Samantha Valencia is a 27 y.o. female. Pt with a history of diabetes presents with wound on bottom of right foot. Pt had been in the hospital for recurrent right lower extremity cellulitis. On initial presentation, wound had thick callus in place but pt stated that she could feel pain under callus. Callus debrided in clinic revealing a fissure present. No signs of infection noted in clinic at that time. Pt was wearing an offloading boot for a majority of the day. Pt with a CGM and last A1C on 01/31/23 was 6.0%. Pt does not smoke. Pt eats a fair amount of protein in her diet. Was regularly wearing compression. Had MRI on 02/24/23 which showed no signs of osteomyelitis. Pt was in hospital in mid-May for cellulitis. Readmitted on 04/05/23 for cellulitis again in her right leg. Pt discharged but noted to have cellulitis again in right leg so admitted to hospital on 04/25/23. Receiving IV antibiotics with plan to transition back to oral medications. Imaging negative. Followed by ID. Continued hydrofera blue and pt followed up in wound clinic. Noted to have some increased drainage but no signs of infection. Stated that offloading boot had worn out but pt using wedge for offloading. Discussed additional forms of offloading and pt stated that she would try crutches. Due to increased drainage, started Aquacel Ag dressing. Pt had further pain and redness in foot so went to ER and was given antibiotics. Eventually contracted C Diff and required po Vanc for treatment. Wound is clean but not improving. Some callus noted around wound which was trimmed  in clinic. Pt noted continued drainage which sometimes would seep through dressing. Pt also had complaints of pain in third, fourth, and fifth toes but no clinical signs of infection were noted at that time. Xray was negative for signs of osteomyelitis. Pt seen by Dr. Abbe Amsterdam at [facility]. Performed wound care and started antibiotics for cellulitis. Also recommended an MRI. Note indicated that hyperbaric treatments were being considered. Switched pt to hydrofera for antibacterial properties but wound was still draining a fair amount.    Review of Systems   Skin:  Positive for wound.      Objective:          acetaminophen (TYLENOL) 325 mg tablet Take two tablets by mouth every 4 hours as needed. Indications: pain    atorvastatin (LIPITOR) 10 mg tablet Take one tablet by mouth daily.    cholecalciferol (vitamin D3) (VITAMIN D3 PO) Take 2 Gummy by mouth daily. 2 gummies = 2000 IU    DEXCOM G7 SENSOR sensor device FOR USE EVERY 10 DAYS AS DIRECTED    empagliflozin (JARDIANCE) 25 mg tablet Take one tablet by mouth daily.    ferrous sulfate (FEOSOL) 325 mg (65 mg iron) tablet Take one tablet by mouth three times weekly. Take on an empty stomach at least 1 hour before or 2 hours after food.  Indications: anemia from inadequate iron    FLORAJEN DIGESTION 15 billion cell cap Take 1 capsule by mouth every morning.    fluoxetine (PROZAC) 40 mg capsule Take one capsule by mouth at bedtime daily.    furosemide (LASIX) 20 mg tablet Take one tablet by mouth every morning. Indications: visible water retention    gabapentin (NEURONTIN) 100 mg capsule Take one capsule by mouth at bedtime daily.    hydroCHLOROthiazide (HYDRODIURIL) 25 mg tablet Take one tablet by mouth every morning.    hydrOXYzine HCL (ATARAX) 25 mg tablet Take one tablet by mouth every morning. Indications: anxious    ibuprofen (MOTRIN) 800 mg tablet Take one tablet by mouth every 8 hours as needed for Pain. Take with food.  Indications: pain    insulin glargine(+) (LANTUS U-100 INSULIN) 100 unit/mL subcutaneous solution Inject fifteen Units under the skin at bedtime daily. Indications: type 2 diabetes mellitus    lidocaine (LIDODERM) 5 % topical patch Apply one patch topically to affected area daily. Apply patch for 12 hours, then remove for 12 hours before repeating.  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    magnesium oxide (MAGOX) 400 mg (241.3 mg magnesium) tablet Take one tablet by mouth twice daily.    medroxyPROGESTERone (DEPO-PROVERA) 150 mg/mL injection Inject 1 mL into the muscle every 90 days.    melatonin 5 mg chew Chew 3 Gummy by mouth at bedtime as needed.    metFORMIN (GLUCOPHAGE) 1,000 mg tablet Take one tablet by mouth twice daily with meals.    Miscellaneous Medical Supply misc 1 set of crutches    Miscellaneous Medical Supply misc Offloading boot for right foot ulceration    montelukast (SINGULAIR) 10 mg tablet Take one tablet by mouth at bedtime daily.    other medication Medication Name & Strength: AZO Yeast Plus    Dose(how many): 1 tablet Frequency(how often): three times daily as needed    oxyCODONE (ROXICODONE) 5 mg tablet Take one tablet by mouth every 4 hours as needed for Pain.    oxyCODONE (ROXICODONE) 5 mg tablet  Take one tablet by mouth every 4 hours as needed. Indications: pain    OZEMPIC 2 mg/dose (8 mg/3 mL) injection PEN Inject two mg under the skin every 7 days. Sundays    PROAIR HFA 90 mcg/actuation inhaler Inhale two puffs by mouth into the lungs every 4 hours as needed.    prochlorperazine maleate (COMPAZINE) 10 mg tablet Take one tablet by mouth every 8 hours as needed for Other... (take with benadryl as needed for migraine headache).    riboflavin (vitamin B2) 400 mg tablet Take one tablet by mouth daily.    SUMAtriptan succinate (IMITREX) 100 mg tablet Take one tablet by mouth at onset of headache. May repeat after 2 hours if needed. Max of 200 mg in 24 hours.  Indications: a migraine headache, DO NOT TAKE THIS MEDICINE WHILE YOU ARE ON LINEZOLID    tiZANidine (ZANAFLEX) 4 mg tablet Take one-half tablet by mouth daily as needed.    topiramate (TOPAMAX) 50 mg tablet TAKE 1 TABLET BY MOUTH IN THE MORNING AND 2 AT NIGHT FOR TOTAL OF 150 MG DAILY     Vitals:    08/11/23 0904   BP: 130/66   Pulse: 77   Temp: 36.5 ?C (97.7 ?F)   SpO2: 99%   PainSc: Six     There is no height or weight on file to calculate BMI.   Physical Exam  Constitutional:       Appearance: Normal appearance.   Eyes:      Conjunctiva/sclera: Conjunctivae normal.      Pupils: Pupils are equal, round, and reactive to light.   Cardiovascular:      Rate and Rhythm: Normal rate.   Pulmonary:      Effort: Pulmonary effort is normal.   Musculoskeletal:         General: Normal range of motion.      Cervical back: Normal range of motion.   Skin:     General: Skin is warm and dry.   Neurological:      Mental Status: She is alert. Mental status is at baseline.   Psychiatric:         Mood and Affect: Mood normal.         Thought Content: Thought content normal. Wounds Pressure injury Right;Plantar (Active)   03/07/23 0935   Wound Type: Pressure injury   Orientation: Right;Plantar   Location:    Wound Location Comments:    Initial Wound Site Closure:    Initial Dressing Placed:    Initial Cycle:    Initial Suction Setting (mmHg):    Pressure Injury Stages:    Pressure Injury Present Within 24 Hours of Hospital Admission:    If This Pressure Injury Is Suspected to Be Device Related, Please Select the Device::    Is the Wound Open or Closed:    Image   08/11/23 0900   Wound Assessment Moist;Red;Pale 08/11/23 0900   Peri-wound Assessment Callous 08/11/23 0900   Wound Drainage Amount Moderate 08/11/23 0900   Wound Drainage Description Serosanguineous 08/11/23 0900   Wound Dressing Status Intact 05/26/23 0900   Wound Care Dressing changed or new application 08/11/23 0900   Wound Dressing and/or Treatment Hypafix tape;Abdominal pad 08/11/23 0900   Wound Status (Wound Team Only) Being Treated 08/11/23 0900   Wound Length (cm) (Wound Team Only) 0.9 cm 08/11/23 0900   Wound Width (cm) (Wound Team Only) 0.9 cm 08/11/23 0900   Wound Depth (cm) (Wound Team Only) 0.4 cm 08/11/23 0900  Wound Surface Area (cm^2) (Wound Team Only) 0.81 cm^2 08/11/23 0900   Wound Volume (cm^3) (Wound Team Only) 0.324 cm^3 08/11/23 0900   Wound Healing % (Wound Team Only) -1250 08/11/23 0900   Undermining in CM (Wound Team Only) 0 cm 06/30/23 0800   Tunneling in CM (Wound Team Only) 0 cm 06/30/23 0800   Number of days: 157                       ALL wounds debrided using: mechanical     Assessment and Plan:  Diabetic foot ulcer, right plantar foot, 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 Abbe Amsterdam as scheduled    - Follow-up with [facility] wound clinic as desired, recommend getting care at single wound care center    Total Time Today was 30 minutes in the following activities: Preparing to see the patient, Performing a medically appropriate examination and/or evaluation, Counseling and educating the patient/family/caregiver, Documenting clinical information in the electronic or other health record, and Independently interpreting results (not separately reported) and communicating results to the patient/family/caregiver

## 2023-08-11 NOTE — Patient Instructions
-   Aquacel Ag dresing, cover with ABD, secure with kerlic, change daily  - Keep clean and dry  - Offloading boot  - Elevate feet higher than level of heart at least 20 minutes twice a day  - Maintain adequate glucose levels  - Follow-up with Dr. Abbe Amsterdam as scheduled    - Follow-up with [facility] wound clinic as desired, recommend getting care at single wound care center

## 2023-08-12 DIAGNOSIS — E11621 Type 2 diabetes mellitus with foot ulcer: Secondary | ICD-10-CM

## 2023-08-12 DIAGNOSIS — L97412 Non-pressure chronic ulcer of right heel and midfoot with fat layer exposed: Secondary | ICD-10-CM

## 2023-08-15 ENCOUNTER — Encounter: Admit: 2023-08-15 | Discharge: 2023-08-15 | Payer: BC Managed Care – PPO | Primary: Family

## 2023-08-17 ENCOUNTER — Encounter: Admit: 2023-08-17 | Discharge: 2023-08-17 | Payer: BC Managed Care – PPO | Primary: Family

## 2023-08-22 ENCOUNTER — Encounter: Admit: 2023-08-22 | Discharge: 2023-08-22 | Payer: BC Managed Care – PPO | Primary: Family

## 2023-09-13 ENCOUNTER — Encounter: Admit: 2023-09-13 | Discharge: 2023-09-13 | Payer: BC Managed Care – PPO | Primary: Family

## 2023-09-15 ENCOUNTER — Encounter: Admit: 2023-09-15 | Discharge: 2023-09-15 | Payer: BC Managed Care – PPO | Primary: Family

## 2023-09-19 ENCOUNTER — Encounter: Admit: 2023-09-19 | Discharge: 2023-09-19 | Payer: BC Managed Care – PPO | Primary: Family

## 2023-09-20 ENCOUNTER — Encounter: Admit: 2023-09-20 | Discharge: 2023-09-20 | Payer: BC Managed Care – PPO | Primary: Family

## 2023-09-21 ENCOUNTER — Encounter: Admit: 2023-09-21 | Discharge: 2023-09-21 | Payer: BC Managed Care – PPO | Primary: Family

## 2023-10-04 ENCOUNTER — Encounter: Admit: 2023-10-04 | Discharge: 2023-10-04 | Payer: BC Managed Care – PPO | Primary: Family

## 2023-10-05 ENCOUNTER — Encounter: Admit: 2023-10-05 | Discharge: 2023-10-05 | Payer: BC Managed Care – PPO | Primary: Family

## 2023-10-12 ENCOUNTER — Encounter: Admit: 2023-10-12 | Discharge: 2023-10-12 | Payer: BC Managed Care – PPO | Primary: Family

## 2023-10-17 ENCOUNTER — Encounter: Admit: 2023-10-17 | Discharge: 2023-10-17 | Payer: BC Managed Care – PPO | Primary: Family

## 2023-10-18 ENCOUNTER — Encounter: Admit: 2023-10-18 | Discharge: 2023-10-18 | Payer: BC Managed Care – PPO | Primary: Family

## 2023-10-21 ENCOUNTER — Encounter: Admit: 2023-10-21 | Discharge: 2023-10-21 | Payer: BC Managed Care – PPO | Primary: Family

## 2023-10-23 ENCOUNTER — Encounter: Admit: 2023-10-23 | Discharge: 2023-10-23 | Payer: BC Managed Care – PPO | Primary: Family

## 2023-11-01 ENCOUNTER — Encounter: Admit: 2023-11-01 | Discharge: 2023-11-01 | Payer: BC Managed Care – PPO | Primary: Family

## 2023-11-03 ENCOUNTER — Encounter: Admit: 2023-11-03 | Discharge: 2023-11-03 | Payer: BC Managed Care – PPO | Primary: Family

## 2023-11-07 ENCOUNTER — Encounter: Admit: 2023-11-07 | Discharge: 2023-11-07 | Payer: BC Managed Care – PPO | Primary: Family

## 2023-11-07 NOTE — Telephone Encounter
-----   Message from Margarita Grizzle, DO sent at 11/07/2023  4:56 PM CST -----  Please let Bri know that her cardiac monitor does not reveal any concerning rhythm abnormalities.  Her patient triggers did not correspond to any arrhythmias.  Will continue to monitor at this time.  Can we have her schedule a follow-up appointment in 6 months.

## 2023-11-07 NOTE — Telephone Encounter
11/07/23 @ 1701pm- pt notified via [facility] of the below results/recommendations from Dr. Art Buff         ----- Message -----  From: Shaune Leeks, DO  Sent: 11/07/2023   4:56 PM CST  To: [facility] Nurse Gen Card Team Gold    Please let Bri know that her cardiac monitor does not reveal any concerning rhythm abnormalities.  Her patient triggers did not correspond to any arrhythmias.  Will continue to monitor at this time.  Can we have her schedule a follow-up appointment in 6 months.

## 2023-11-15 ENCOUNTER — Encounter: Admit: 2023-11-15 | Discharge: 2023-11-15 | Payer: BC Managed Care – PPO | Primary: Family

## 2023-11-22 ENCOUNTER — Ambulatory Visit: Admit: 2023-11-22 | Discharge: 2023-11-23 | Payer: BC Managed Care – PPO | Primary: Family

## 2023-11-26 ENCOUNTER — Ambulatory Visit: Admit: 2023-11-26 | Discharge: 2023-11-26 | Payer: BC Managed Care – PPO | Primary: Family

## 2023-11-28 ENCOUNTER — Ambulatory Visit: Admit: 2023-11-28 | Discharge: 2023-11-29 | Payer: BC Managed Care – PPO | Primary: Family

## 2023-11-29 ENCOUNTER — Encounter: Admit: 2023-11-29 | Discharge: 2023-11-29 | Payer: BC Managed Care – PPO | Primary: Family

## 2023-12-04 ENCOUNTER — Ambulatory Visit: Admit: 2023-12-04 | Discharge: 2023-12-05 | Payer: BC Managed Care – PPO | Primary: Family

## 2023-12-04 ENCOUNTER — Encounter: Admit: 2023-12-04 | Discharge: 2023-12-04 | Payer: BC Managed Care – PPO | Primary: Family

## 2023-12-07 ENCOUNTER — Encounter: Admit: 2023-12-07 | Discharge: 2023-12-07 | Payer: BC Managed Care – PPO | Primary: Family

## 2023-12-08 ENCOUNTER — Encounter: Admit: 2023-12-08 | Discharge: 2023-12-08 | Payer: BC Managed Care – PPO | Primary: Family

## 2023-12-13 ENCOUNTER — Encounter: Admit: 2023-12-13 | Discharge: 2023-12-13 | Payer: BC Managed Care – PPO | Primary: Family

## 2023-12-19 ENCOUNTER — Encounter: Admit: 2023-12-19 | Discharge: 2023-12-19 | Payer: BC Managed Care – PPO | Primary: Family

## 2023-12-21 ENCOUNTER — Encounter: Admit: 2023-12-21 | Discharge: 2023-12-21 | Payer: BC Managed Care – PPO | Primary: Family

## 2023-12-25 ENCOUNTER — Encounter: Admit: 2023-12-25 | Discharge: 2023-12-25 | Payer: BC Managed Care – PPO | Primary: Family

## 2023-12-28 ENCOUNTER — Encounter: Admit: 2023-12-28 | Discharge: 2023-12-28 | Payer: BC Managed Care – PPO | Primary: Family

## 2023-12-28 ENCOUNTER — Ambulatory Visit: Admit: 2023-12-28 | Discharge: 2023-12-29 | Payer: BC Managed Care – PPO | Primary: Family

## 2023-12-28 DIAGNOSIS — E1159 Type 2 diabetes mellitus with other circulatory complications: Secondary | ICD-10-CM

## 2023-12-28 DIAGNOSIS — E785 Hyperlipidemia, unspecified: Secondary | ICD-10-CM

## 2023-12-28 DIAGNOSIS — Z89421 Acquired absence of other right toe(s): Secondary | ICD-10-CM

## 2023-12-28 DIAGNOSIS — S91301D Unspecified open wound, right foot, subsequent encounter: Secondary | ICD-10-CM

## 2023-12-28 NOTE — Patient Instructions
 Samantha Valencia does not recommend any intervention at this time.     Recommends follow-up in approximately ___ months with the following imaging: _____.     Continue taking all medications as prescribed.       Wound care recommendations:     Keep wound dry and clean. You will continue wound care therapy as directed by the wound clinic.     You may shower normally daily. Wash wound with mild soapy water (allowing soapy water to run over wound- do not scrub wound bed), rinse well and pat dry. Do not submerge the wound in water (baths, pools, hot tubs, lakes, rivers, ocean, etc).

## 2023-12-28 NOTE — Progress Notes
 Date of Service: 12/28/2023              Chief Complaint   Patient presents with    Post Operative Visit     R 4th Toe Amp - 12/04/23.  Wound Vac?       History of Present Illness    This is a 28 year old diabetic female with a history of hypertension, hyperlipidemia and peripheral neuropathy.  She is status post closed right fourth toe amputation with metatarsal head resection on 12/04/2023 by Dr. Maye Hides at [facility].  She has no complaints of foot pain.  She denies chest pain and shortness of breath.  She denies fever and chills.  She continues to go to the [facility] wound care center.  Her wound VAC has been on hold but she believes that they are resuming therapy tomorrow.  She is on IV antibiotics through a left upper arm PICC line.    Her most recent hemoglobin A1c was 5.4.  Her most recent creatinine was 0.83 with a GFR of 99.  She does not smoke.    She is not on any antiplatelet or anticoagulant agents.  She is on atorvastatin daily.        Past Medical History:    Adverse drug reaction    Anxiety    Chest pain    Depression    Diabetes (HCC)    Dizziness    High cholesterol    History of PCOS    Hypertension    Infection    Lymphedema    Tachyarrhythmia    Unspecified deficiency anemia       History reviewed. No pertinent surgical history.    Allergies:  Allergies   Allergen Reactions    Pcn [Penicillins] HIVES    Keflex [Cephalexin] UNKNOWN       Medication List:   acetaminophen (TYLENOL) 325 mg tablet Take two tablets by mouth every 4 hours as needed. Indications: pain    atorvastatin (LIPITOR) 10 mg tablet Take one tablet by mouth daily.    buPROPion XL (WELLBUTRIN XL) 150 mg tablet Take one tablet by mouth every morning.    cholecalciferol (vitamin D3) (VITAMIN D3 PO) Take 2 Gummy by mouth daily. 2 gummies = 2000 IU    DEXCOM G7 SENSOR sensor device FOR USE EVERY 10 DAYS AS DIRECTED    empagliflozin (JARDIANCE) 25 mg tablet Take one tablet by mouth daily.    ferrous sulfate (FEOSOL) 325 mg (65 mg iron) tablet Take one tablet by mouth three times weekly. Take on an empty stomach at least 1 hour before or 2 hours after food.  Indications: anemia from inadequate iron    FLORAJEN DIGESTION 15 billion cell cap Take 1 capsule by mouth every morning.    fluoxetine (PROZAC) 40 mg capsule Take one capsule by mouth at bedtime daily.    furosemide (LASIX) 20 mg tablet Take one tablet by mouth every morning. Indications: visible water retention    gabapentin (NEURONTIN) 100 mg capsule Take one capsule by mouth at bedtime daily.    hydroCHLOROthiazide (HYDRODIURIL) 25 mg tablet Take one tablet by mouth every morning.    hydrOXYzine HCL (ATARAX) 25 mg tablet Take one tablet by mouth every morning. Indications: anxious    ibuprofen (MOTRIN) 800 mg tablet Take one tablet by mouth every 8 hours as needed for Pain. Take with food.  Indications: pain    losartan (COZAAR) 100 mg tablet Take one-half tablet by mouth at bedtime daily.  magnesium oxide (MAGOX) 400 mg (241.3 mg magnesium) tablet Take one tablet by mouth twice daily.    medroxyPROGESTERone (DEPO-PROVERA) 150 mg/mL injection Inject 1 mL into the muscle every 90 days.    melatonin 5 mg chew Chew 3 Gummy by mouth at bedtime as needed.    metoprolol tartrate 25 mg tablet Take one-half tablet by mouth twice daily.    Miscellaneous Medical Supply misc 1 set of crutches    Miscellaneous Medical Supply misc Offloading boot for right foot ulceration    montelukast (SINGULAIR) 10 mg tablet Take one tablet by mouth at bedtime daily.    ondansetron (ZOFRAN ODT) 8 mg rapid dissolve tablet Place one tablet under tongue every 8 hours as needed.    other medication Medication Name & Strength: AZO Yeast Plus    Dose(how many): 1 tablet    Frequency(how often): three times daily as needed    oxymetazoline (AFRIN EXTRA MOISTURIZING) 0.05 % nasal spray Apply two sprays into nose as directed twice daily.    OZEMPIC 2 mg/dose (8 mg/3 mL) injection PEN Inject two mg under the skin every 7 days. Sundays    PROAIR HFA 90 mcg/actuation inhaler Inhale two puffs by mouth into the lungs every 4 hours as needed.    prochlorperazine maleate (COMPAZINE) 10 mg tablet Take one tablet by mouth every 8 hours as needed for Other... (take with benadryl as needed for migraine headache).    riboflavin (vitamin B2) 400 mg tablet Take one tablet by mouth daily.    tiZANidine (ZANAFLEX) 4 mg tablet Take one-half tablet by mouth daily as needed.    topiramate (TOPAMAX) 50 mg tablet TAKE 1 TABLET BY MOUTH ONCE DAILY IN THE MORNING AND 2 ONCE DAILY IN THE EVENING SCHEDULE FOLLOW UP APPOINTMENT       Social History:   reports that she has never smoked. She has never used smokeless tobacco. She reports that she does not drink alcohol and does not use drugs.    Family History   Problem Relation Name Age of Onset    Bipolar Disorder Mother      Depression Mother          bipolar    Schizophrenia Father      Diabetes Maternal Grandfather jdoe     Cancer Maternal Grandmother Daisy        Review of Systems            Vitals:    12/28/23 1242   BP: 107/74   BP Source: Arm, Right Upper   Pulse: 79   Temp: 36.2 ?C (97.2 ?F)   TempSrc: Temporal   PainSc: Zero   Weight: 124 kg (273 lb 6.4 oz)   Height: 172.7 cm (5' 8)     Body mass index is 41.57 kg/m?Marland Kitchen     Physical Exam  Vitals and nursing note reviewed.   Constitutional:       General: She is not in acute distress.     Appearance: Normal appearance. She is well-developed. She is morbidly obese. She is not ill-appearing or diaphoretic.   HENT:      Head: Normocephalic.   Eyes:      General:         Right eye: No discharge.         Left eye: No discharge.   Neck:      Trachea: Phonation normal.   Cardiovascular:      Rate and Rhythm: Normal rate and regular rhythm.  Pulses:           Dorsalis pedis pulses are 2+ on the right side and 2+ on the left side.        Posterior tibial pulses are 2+ on the right side and 2+ on the left side.   Pulmonary: Effort: Pulmonary effort is normal. No respiratory distress.   Musculoskeletal:      Cervical back: Normal range of motion.      Left foot: Normal range of motion. No deformity.   Feet:      Right foot:      Skin integrity: Dry skin present.      Left foot:      Skin integrity: Dry skin present. No ulcer, blister, skin breakdown or erythema.      Comments: Her right fourth toe amputation site has healed.  There is a circular hole on the plantar surface of her foot associated with the wound.  This is being packed currently with Aquacel Ag.  We replaced the Aquacel Ag, moistened with saline and covered it with a bandage.  Skin:     General: Skin is warm and dry.      Capillary Refill: Capillary refill takes less than 2 seconds.   Neurological:      Mental Status: She is alert and oriented to person, place, and time.   Psychiatric:         Speech: Speech normal.         Behavior: Behavior normal. Behavior is cooperative.         Thought Content: Thought content normal.             Assessment and Plan:    1. Status post amputation of lesser toe of right foot (closed right fourth toe amp with met head resection, Dr. Maye Hides, 12/04/2023, [facility])        2. Open wound of right foot, subsequent encounter        3. Diabetic vasculopathy (HCC)        4. Dyslipidemia          She has mostly healed her close right fourth toe amputation site.  It appears that she was perhaps at the wound care center today and had sutures removed.  She had a small amount of Aquacel Ag tucked into her plantar foot wound.  She reports that she is to have her wound VAC replaced tomorrow.  She has no complaints of foot pain.  She continues on IV antibiotics through a left upper arm PICC line.  She has palpable pedal pulses.  I discussed with her my recommendations.  From our standpoint, as long as she can keep her PICC line dry, she may coordinate with wound care and shower getting soap and running water on her foot/feet, rinsing well and patting dry.    She should continue her atorvastatin as directed.    Continue care at the wound care center until her wounds have healed.  Continue antibiotics per infectious disease.  We can be reconsulted as needed if further wound debridement is requested or further amputation is required.    From a vascular surgery standpoint, we can follow-up on an as-needed basis.    She did tell me that she had requested and signed consent to keep her amputated toe.  We will give her the number to pathology at [facility] to check on the status of her toe.  (We did find out that [facility] still does have her toe  and it is waiting for her to pick up)    Kathaleen Maser, APRN-NP    >  21     Minutes spent on chart review, physical exam and documentation.

## 2024-01-12 ENCOUNTER — Encounter: Admit: 2024-01-12 | Discharge: 2024-01-12 | Payer: BC Managed Care – PPO | Primary: Family

## 2024-01-16 ENCOUNTER — Encounter: Admit: 2024-01-16 | Discharge: 2024-01-16 | Payer: BC Managed Care – PPO | Primary: Family

## 2024-01-28 ENCOUNTER — Encounter: Admit: 2024-01-28 | Discharge: 2024-01-28 | Primary: Family

## 2024-02-03 ENCOUNTER — Encounter: Admit: 2024-02-03 | Discharge: 2024-02-03 | Primary: Family

## 2024-02-07 ENCOUNTER — Encounter: Admit: 2024-02-07 | Discharge: 2024-02-07 | Primary: Family

## 2024-02-08 ENCOUNTER — Encounter: Admit: 2024-02-08 | Discharge: 2024-02-08 | Primary: Family

## 2024-02-11 ENCOUNTER — Encounter: Admit: 2024-02-11 | Discharge: 2024-02-11 | Payer: BLUE CROSS/BLUE SHIELD | Primary: Family
